# Patient Record
Sex: Male | Born: 1940
Health system: Southern US, Community
[De-identification: ages and names within clinical notes are randomized; demographics above are authoritative.]

## PROBLEM LIST (undated history)

## (undated) DIAGNOSIS — M1990 Unspecified osteoarthritis, unspecified site: Secondary | ICD-10-CM

## (undated) DIAGNOSIS — I10 Essential (primary) hypertension: Secondary | ICD-10-CM

## (undated) DIAGNOSIS — K5641 Fecal impaction: Secondary | ICD-10-CM

## (undated) DIAGNOSIS — N183 Chronic kidney disease, stage 3 unspecified: Secondary | ICD-10-CM

## (undated) DIAGNOSIS — I779 Disorder of arteries and arterioles, unspecified: Secondary | ICD-10-CM

## (undated) DIAGNOSIS — N529 Male erectile dysfunction, unspecified: Secondary | ICD-10-CM

## (undated) DIAGNOSIS — I739 Peripheral vascular disease, unspecified: Secondary | ICD-10-CM

## (undated) DIAGNOSIS — R809 Proteinuria, unspecified: Secondary | ICD-10-CM

## (undated) DIAGNOSIS — E785 Hyperlipidemia, unspecified: Secondary | ICD-10-CM

## (undated) DIAGNOSIS — E119 Type 2 diabetes mellitus without complications: Secondary | ICD-10-CM

## (undated) HISTORY — DX: Essential (primary) hypertension: I10

## (undated) HISTORY — PX: WISDOM TOOTH EXTRACTION: SHX21

## (undated) HISTORY — DX: Chronic kidney disease, stage 3 unspecified: N18.30

## (undated) HISTORY — DX: Type 2 diabetes mellitus without complications: E11.9

## (undated) HISTORY — DX: Chronic kidney disease, stage 3 (moderate): N18.3

## (undated) HISTORY — DX: Proteinuria, unspecified: R80.9

## (undated) HISTORY — DX: Male erectile dysfunction, unspecified: N52.9

## (undated) HISTORY — DX: Disorder of arteries and arterioles, unspecified: I77.9

## (undated) HISTORY — PX: CIRCUMCISION: SUR203

## (undated) HISTORY — DX: Peripheral vascular disease, unspecified: I73.9

---

## 2001-10-09 ENCOUNTER — Encounter: Admission: RE | Admit: 2001-10-09 | Discharge: 2002-01-07 | Payer: Self-pay | Admitting: Internal Medicine

## 2003-11-23 ENCOUNTER — Ambulatory Visit (HOSPITAL_COMMUNITY): Admission: RE | Admit: 2003-11-23 | Discharge: 2003-11-23 | Payer: Self-pay | Admitting: Internal Medicine

## 2004-04-28 ENCOUNTER — Ambulatory Visit (HOSPITAL_COMMUNITY): Admission: RE | Admit: 2004-04-28 | Discharge: 2004-04-28 | Payer: Self-pay | Admitting: Internal Medicine

## 2014-07-26 DIAGNOSIS — Z79899 Other long term (current) drug therapy: Secondary | ICD-10-CM | POA: Diagnosis not present

## 2014-07-26 DIAGNOSIS — I10 Essential (primary) hypertension: Secondary | ICD-10-CM | POA: Diagnosis not present

## 2014-07-26 DIAGNOSIS — E785 Hyperlipidemia, unspecified: Secondary | ICD-10-CM | POA: Diagnosis not present

## 2014-07-26 DIAGNOSIS — N189 Chronic kidney disease, unspecified: Secondary | ICD-10-CM | POA: Diagnosis not present

## 2014-07-26 DIAGNOSIS — N529 Male erectile dysfunction, unspecified: Secondary | ICD-10-CM | POA: Diagnosis not present

## 2014-07-30 ENCOUNTER — Other Ambulatory Visit (HOSPITAL_COMMUNITY): Payer: Self-pay | Admitting: Internal Medicine

## 2014-07-30 DIAGNOSIS — R0989 Other specified symptoms and signs involving the circulatory and respiratory systems: Secondary | ICD-10-CM

## 2014-07-30 DIAGNOSIS — I10 Essential (primary) hypertension: Secondary | ICD-10-CM | POA: Diagnosis not present

## 2014-07-30 DIAGNOSIS — E785 Hyperlipidemia, unspecified: Secondary | ICD-10-CM | POA: Diagnosis not present

## 2014-07-30 DIAGNOSIS — N183 Chronic kidney disease, stage 3 (moderate): Secondary | ICD-10-CM | POA: Diagnosis not present

## 2014-07-30 DIAGNOSIS — E1129 Type 2 diabetes mellitus with other diabetic kidney complication: Secondary | ICD-10-CM | POA: Diagnosis not present

## 2014-08-03 ENCOUNTER — Ambulatory Visit (HOSPITAL_COMMUNITY)
Admission: RE | Admit: 2014-08-03 | Discharge: 2014-08-03 | Disposition: A | Discharge status: Home / Self Care | Payer: Medicare Other | Source: Ambulatory Visit | Attending: Internal Medicine | Admitting: Internal Medicine

## 2014-08-03 ENCOUNTER — Ambulatory Visit (HOSPITAL_COMMUNITY): Payer: Self-pay

## 2014-08-03 DIAGNOSIS — R0989 Other specified symptoms and signs involving the circulatory and respiratory systems: Secondary | ICD-10-CM | POA: Insufficient documentation

## 2014-08-03 DIAGNOSIS — I10 Essential (primary) hypertension: Secondary | ICD-10-CM | POA: Diagnosis not present

## 2014-08-03 DIAGNOSIS — Z87891 Personal history of nicotine dependence: Secondary | ICD-10-CM | POA: Insufficient documentation

## 2014-08-03 DIAGNOSIS — E119 Type 2 diabetes mellitus without complications: Secondary | ICD-10-CM | POA: Insufficient documentation

## 2014-08-03 DIAGNOSIS — I6523 Occlusion and stenosis of bilateral carotid arteries: Secondary | ICD-10-CM | POA: Diagnosis not present

## 2014-08-19 DIAGNOSIS — M25562 Pain in left knee: Secondary | ICD-10-CM | POA: Diagnosis not present

## 2014-12-27 DIAGNOSIS — Z79899 Other long term (current) drug therapy: Secondary | ICD-10-CM | POA: Diagnosis not present

## 2014-12-27 DIAGNOSIS — E119 Type 2 diabetes mellitus without complications: Secondary | ICD-10-CM | POA: Diagnosis not present

## 2015-01-07 DIAGNOSIS — E119 Type 2 diabetes mellitus without complications: Secondary | ICD-10-CM | POA: Diagnosis not present

## 2015-01-07 DIAGNOSIS — Z23 Encounter for immunization: Secondary | ICD-10-CM | POA: Diagnosis not present

## 2015-01-07 DIAGNOSIS — N183 Chronic kidney disease, stage 3 (moderate): Secondary | ICD-10-CM | POA: Diagnosis not present

## 2015-01-07 DIAGNOSIS — I10 Essential (primary) hypertension: Secondary | ICD-10-CM | POA: Diagnosis not present

## 2015-01-07 DIAGNOSIS — Z6820 Body mass index (BMI) 20.0-20.9, adult: Secondary | ICD-10-CM | POA: Diagnosis not present

## 2015-04-07 DIAGNOSIS — Z6820 Body mass index (BMI) 20.0-20.9, adult: Secondary | ICD-10-CM | POA: Diagnosis not present

## 2015-04-07 DIAGNOSIS — K625 Hemorrhage of anus and rectum: Secondary | ICD-10-CM | POA: Diagnosis not present

## 2015-04-20 ENCOUNTER — Telehealth: Payer: Self-pay

## 2015-04-20 NOTE — Telephone Encounter (Signed)
Gastroenterology Pre-Procedure Review  Request Date: Requesting Physician:   PATIENT REVIEW QUESTIONS: The patient responded to the following health history questions as indicated:    1. Diabetes Melitis: NO 2. Joint replacements in the past 12 months: NO 3. Major health problems in the past 3 months: NO 4. Has an artificial valve or MVP: NO 5. Has a defibrillator: NO 6. Has been advised in past to take antibiotics in advance of a procedure like teeth cleaning: NO 7. Family history of colon cancer: NO 8. Alcohol Use: YES: pint in a week 9. History of sleep apnea: NO    MEDICATIONS & ALLERGIES:    Patient reports the following regarding taking any blood thinners:   Plavix? NO Aspirin? NO Coumadin? NO  Patient confirms/reports the following medications:  Current Outpatient Prescriptions  Medication Sig Dispense Refill  . amLODipine (NORVASC) 10 MG tablet Take 10 mg by mouth daily.    Marland Kitchen aspirin 81 MG tablet Take 81 mg by mouth daily.    Marland Kitchen lisinopril (PRINIVIL,ZESTRIL) 20 MG tablet Take 20 mg by mouth daily.    . simvastatin (ZOCOR) 20 MG tablet Take 20 mg by mouth daily.     No current facility-administered medications for this visit.    Patient confirms/reports the following allergies:  Allergies not on file  No orders of the defined types were placed in this encounter.    AUTHORIZATION INFORMATION Primary Insurance: [facility] AAPR Medicare,  [place] [id number] ,  Group #: [id number] Pre-Cert / Josem Kaufmann required: Pre-Cert / Auth #:    SCHEDULE INFORMATION: Procedure has been scheduled as follows:  Date: , Time:  Location:   This Gastroenterology Pre-Precedure Review Form is being routed to the following provider(s):

## 2015-04-20 NOTE — Telephone Encounter (Signed)
Discussed with Dr. Gala Romney. He agrees to triage BUT we need to order phenergan 12.5mg  IV 30 minutes before procedure given his etoh use.

## 2015-04-21 NOTE — Telephone Encounter (Signed)
Tried to call with answer  

## 2015-04-25 NOTE — Telephone Encounter (Signed)
Tried to call with no answer  

## 2015-04-26 ENCOUNTER — Other Ambulatory Visit: Payer: Self-pay

## 2015-04-26 DIAGNOSIS — Z1211 Encounter for screening for malignant neoplasm of colon: Secondary | ICD-10-CM

## 2015-04-26 MED ORDER — PEG 3350-KCL-NA BICARB-NACL 420 G PO SOLR: 4000.0000 mL | ORAL | Status: AC

## 2015-04-26 NOTE — Telephone Encounter (Signed)
Pt is set up for TCS on 05/11/15 @ 230 pm instructions are in the mail and he is aware

## 2015-04-26 NOTE — Telephone Encounter (Signed)
PA # [id number]

## 2015-05-06 DIAGNOSIS — E119 Type 2 diabetes mellitus without complications: Secondary | ICD-10-CM | POA: Diagnosis not present

## 2015-05-06 DIAGNOSIS — Z79899 Other long term (current) drug therapy: Secondary | ICD-10-CM | POA: Diagnosis not present

## 2015-05-06 DIAGNOSIS — E785 Hyperlipidemia, unspecified: Secondary | ICD-10-CM | POA: Diagnosis not present

## 2015-05-06 DIAGNOSIS — N189 Chronic kidney disease, unspecified: Secondary | ICD-10-CM | POA: Diagnosis not present

## 2015-05-06 DIAGNOSIS — I10 Essential (primary) hypertension: Secondary | ICD-10-CM | POA: Diagnosis not present

## 2015-05-11 ENCOUNTER — Encounter (HOSPITAL_COMMUNITY)
Admission: RE | Disposition: A | Discharge status: Home / Self Care | Payer: Self-pay | Source: Ambulatory Visit | Attending: Internal Medicine

## 2015-05-11 ENCOUNTER — Ambulatory Visit (HOSPITAL_COMMUNITY)
Admission: RE | Admit: 2015-05-11 | Discharge: 2015-05-11 | Disposition: A | Discharge status: Home / Self Care | Payer: Medicare Other | Source: Ambulatory Visit | Attending: Internal Medicine | Admitting: Internal Medicine

## 2015-05-11 ENCOUNTER — Encounter (HOSPITAL_COMMUNITY): Payer: Self-pay | Admitting: *Deleted

## 2015-05-11 DIAGNOSIS — D122 Benign neoplasm of ascending colon: Secondary | ICD-10-CM | POA: Insufficient documentation

## 2015-05-11 DIAGNOSIS — Z1211 Encounter for screening for malignant neoplasm of colon: Secondary | ICD-10-CM | POA: Insufficient documentation

## 2015-05-11 DIAGNOSIS — D124 Benign neoplasm of descending colon: Secondary | ICD-10-CM | POA: Diagnosis not present

## 2015-05-11 DIAGNOSIS — Z7982 Long term (current) use of aspirin: Secondary | ICD-10-CM | POA: Insufficient documentation

## 2015-05-11 DIAGNOSIS — K5730 Diverticulosis of large intestine without perforation or abscess without bleeding: Secondary | ICD-10-CM | POA: Insufficient documentation

## 2015-05-11 HISTORY — PX: COLONOSCOPY: SHX5424

## 2015-05-11 SURGERY — COLONOSCOPY
Anesthesia: Moderate Sedation

## 2015-05-11 MED ORDER — ONDANSETRON HCL 4 MG/2ML IJ SOLN
INTRAMUSCULAR | Status: DC | PRN
  Administered 2015-05-11: 4 mg via INTRAVENOUS

## 2015-05-11 MED ORDER — MEPERIDINE HCL 100 MG/ML IJ SOLN
INTRAMUSCULAR | Status: DC | PRN
  Administered 2015-05-11: 50 mg via INTRAVENOUS
  Administered 2015-05-11: 25 mg via INTRAVENOUS

## 2015-05-11 MED ORDER — MIDAZOLAM HCL 5 MG/5ML IJ SOLN
INTRAMUSCULAR | Status: AC
  Filled 2015-05-11: qty 10

## 2015-05-11 MED ORDER — MIDAZOLAM HCL 5 MG/5ML IJ SOLN
INTRAMUSCULAR | Status: DC | PRN
  Administered 2015-05-11: 2 mg via INTRAVENOUS
  Administered 2015-05-11: 1 mg via INTRAVENOUS

## 2015-05-11 MED ORDER — SODIUM CHLORIDE 0.9% FLUSH
INTRAVENOUS | Status: AC
  Filled 2015-05-11: qty 10

## 2015-05-11 MED ORDER — PROMETHAZINE HCL 25 MG/ML IJ SOLN
INTRAMUSCULAR | Status: AC
  Filled 2015-05-11: qty 1

## 2015-05-11 MED ORDER — SODIUM CHLORIDE 0.9 % IV SOLN
INTRAVENOUS | Status: DC
  Administered 2015-05-11: 1000 mL via INTRAVENOUS

## 2015-05-11 MED ORDER — STERILE WATER FOR IRRIGATION IR SOLN
Status: DC | PRN
  Administered 2015-05-11: 2.5 mL

## 2015-05-11 MED ORDER — PROMETHAZINE HCL 25 MG/ML IJ SOLN
12.5000 mg | Freq: Once | INTRAMUSCULAR | Status: AC
  Administered 2015-05-11: 12.5 mg via INTRAVENOUS

## 2015-05-11 MED ORDER — MEPERIDINE HCL 100 MG/ML IJ SOLN
INTRAMUSCULAR | Status: AC
  Filled 2015-05-11: qty 2

## 2015-05-11 MED ORDER — ONDANSETRON HCL 4 MG/2ML IJ SOLN
INTRAMUSCULAR | Status: AC
  Filled 2015-05-11: qty 2

## 2015-05-11 NOTE — Op Note (Signed)
[facility] - Miguel Webb [address] [place], [address]   COLONOSCOPY PROCEDURE REPORT  PATIENT: Miguel Webb, Miguel Webb  MR#: [id number] BIRTHDATE: 1941/01/04 , 51  yrs. old GENDER: male ENDOSCOPIST: R.  Garfield Cornea, MD FACP [facility] REFERRED BY:Roy Willey Blade, M.D. PROCEDURE DATE:  13-May-2015 PROCEDURE:   Colonoscopy with snare polypectomy INDICATIONS:Average risk colorectal cancer screening examination. MEDICATIONS: Versed 3 mg IV and Demerol 75 mg IV in divided doses. Phenergan 12.5 mg IV.  Zofran 4 mg IV. ASA CLASS:       Class II  CONSENT: The risks, benefits, alternatives and imponderables including but not limited to bleeding, perforation as well as the possibility of a missed lesion have been reviewed.  The potential for biopsy, lesion removal, etc. have also been discussed. Questions have been answered.  All parties agreeable.  Please see the history and physical in the medical record for more information.  DESCRIPTION OF PROCEDURE:   After the risks benefits and alternatives of the procedure were thoroughly explained, informed consent was obtained.  The digital rectal exam revealed no abnormalities of the rectum.   The [id number]  endoscope was introduced through the anus and advanced to the cecum, which was identified by both the appendix and ileocecal valve. No adverse events experienced.   The quality of the prep was adequate  The instrument was then slowly withdrawn as the colon was fully examined. Estimated blood loss is zero unless otherwise noted in this procedure report.      COLON FINDINGS: Normal-appearing rectal mucosa.  Pancolonic diverticula (left side greater than right).  Patient had (1) 5 mm polyp in the ascending segment and a second 5 mm polyp in the mid descending segment; otherwise, the remainder of the colonic mucosa appeared normal.  The above-mentioned polyps were cold snare removed.  Retroflexion was performed. .  Withdrawal  time=10 minutes 0 seconds.  The scope was withdrawn and the procedure completed. COMPLICATIONS: There were no immediate complications.  ENDOSCOPIC IMPRESSION: Pancolonic diverticulosis. Multiple colonic polyps?"removed as described above  RECOMMENDATIONS: Follow-up on pathology.  eSigned:  R. Garfield Cornea, MD Rosalita Chessman [facility] 13-May-2015 3:20 PM   cc:  CPT CODES: ICD CODES:  The ICD and CPT codes recommended by this software are interpretations from the data that the clinical staff has captured with the software.  The verification of the translation of this report to the ICD and CPT codes and modifiers is the sole responsibility of the health care institution and practicing physician where this report was generated.  [employer] will not be held responsible for the validity of the ICD and CPT codes included on this report.  AMA assumes no liability for data contained or not contained herein. CPT is a Designer, television/film set of the [employer].  PATIENT NAME:  Miguel Webb, Miguel Webb MR#: [id number]

## 2015-05-11 NOTE — Discharge Instructions (Signed)
°Colonoscopy °Discharge Instructions ° °Read the instructions outlined below and refer to this sheet in the next few weeks. These discharge instructions provide you with general information on caring for yourself after you leave the hospital. Your doctor may also give you specific instructions. While your treatment has been planned according to the most current medical practices available, unavoidable complications occasionally occur. If you have any problems or questions after discharge, call Dr. Rourk at [phone]. °ACTIVITY °· You may resume your regular activity, but move at a slower pace for the next 24 hours.  °· Take frequent rest periods for the next 24 hours.  °· Walking will help get rid of the air and reduce the bloated feeling in your belly (abdomen).  °· No driving for 24 hours (because of the medicine (anesthesia) used during the test).   °· Do not sign any important legal documents or operate any machinery for 24 hours (because of the anesthesia used during the test).  °NUTRITION °· Drink plenty of fluids.  °· You may resume your normal diet as instructed by your doctor.  °· Begin with a light meal and progress to your normal diet. Heavy or fried foods are harder to digest and may make you feel sick to your stomach (nauseated).  °· Avoid alcoholic beverages for 24 hours or as instructed.  °MEDICATIONS °· You may resume your normal medications unless your doctor tells you otherwise.  °WHAT YOU CAN EXPECT TODAY °· Some feelings of bloating in the abdomen.  °· Passage of more gas than usual.  °· Spotting of blood in your stool or on the toilet paper.  °IF YOU HAD POLYPS REMOVED DURING THE COLONOSCOPY: °· No aspirin products for 7 days or as instructed.  °· No alcohol for 7 days or as instructed.  °· Eat a soft diet for the next 24 hours.  °FINDING OUT THE RESULTS OF YOUR TEST °Not all test results are available during your visit. If your test results are not back during the visit, make an appointment  with your caregiver to find out the results. Do not assume everything is normal if you have not heard from your caregiver or the medical facility. It is important for you to follow up on all of your test results.  °SEEK IMMEDIATE MEDICAL ATTENTION IF: °· You have more than a spotting of blood in your stool.  °· Your belly is swollen (abdominal distention).  °· You are nauseated or vomiting.  °· You have a temperature over 101.  °· You have abdominal pain or discomfort that is severe or gets worse throughout the day.  ° °Diverticulosis and polyp information provided. ° °Further recommendations to follow pending review of pathology report ° ° ° ° °Diverticulosis °Diverticulosis is the condition that develops when small pouches (diverticula) form in the wall of your colon. Your colon, or large intestine, is where water is absorbed and stool is formed. The pouches form when the inside layer of your colon pushes through weak spots in the outer layers of your colon. °CAUSES  °No one knows exactly what causes diverticulosis. °RISK FACTORS °· Being older than 50. Your risk for this condition increases with age. Diverticulosis is rare in people younger than 40 years. By age 80, almost everyone has it. °· Eating a low-fiber diet. °· Being frequently constipated. °· Being overweight. °· Not getting enough exercise. °· Smoking. °· Taking over-the-counter pain medicines, like aspirin and ibuprofen. °SYMPTOMS  °Most people with diverticulosis do not have symptoms. °DIAGNOSIS  °  Because diverticulosis often has no symptoms, health care providers often discover the condition during an exam for other colon problems. In many cases, a health care provider will diagnose diverticulosis while using a flexible scope to examine the colon (colonoscopy). °TREATMENT  °If you have never developed an infection related to diverticulosis, you may not need treatment. If you have had an infection before, treatment may include: °· Eating more fruits,  vegetables, and grains. °· Taking a fiber supplement. °· Taking a live bacteria supplement (probiotic). °· Taking medicine to relax your colon. °HOME CARE INSTRUCTIONS  °· Drink at least 6-8 glasses of water each day to prevent constipation. °· Try not to strain when you have a bowel movement. °· Keep all follow-up appointments. °If you have had an infection before:  °· Increase the fiber in your diet as directed by your health care provider or dietitian. °· Take a dietary fiber supplement if your health care provider approves. °· Only take medicines as directed by your health care provider. °SEEK MEDICAL CARE IF:  °· You have abdominal pain. °· You have bloating. °· You have cramps. °· You have not gone to the bathroom in 3 days. °SEEK IMMEDIATE MEDICAL CARE IF:  °· Your pain gets worse. °· Your bloating becomes very bad. °· You have a fever or chills, and your symptoms suddenly get worse. °· You begin vomiting. °· You have bowel movements that are bloody or black. °MAKE SURE YOU: °· Understand these instructions. °· Will watch your condition. °· Will get help right away if you are not doing well or get worse. °  °This information is not intended to replace advice given to you by your health care provider. Make sure you discuss any questions you have with your health care provider. °  °Document Released: 12/15/2003 Document Revised: 03/24/2013 Document Reviewed: 02/11/2013 °Elsevier Interactive Patient Education ©2016 Elsevier Inc. °Colon Polyps °Polyps are lumps of extra tissue growing inside the body. Polyps can grow in the large intestine (colon). Most colon polyps are noncancerous (benign). However, some colon polyps can become cancerous over time. Polyps that are larger than a pea may be harmful. To be safe, caregivers remove and test all polyps. °CAUSES  °Polyps form when mutations in the genes cause your cells to grow and divide even though no more tissue is needed. °RISK FACTORS °There are a number of risk  factors that can increase your chances of getting colon polyps. They include: °· Being older than 50 years. °· Family history of colon polyps or colon cancer. °· Long-term colon diseases, such as colitis or Crohn disease. °· Being overweight. °· Smoking. °· Being inactive. °· Drinking too much alcohol. °SYMPTOMS  °Most small polyps do not cause symptoms. If symptoms are present, they may include: °· Blood in the stool. The stool may look dark red or black. °· Constipation or diarrhea that lasts longer than 1 week. °DIAGNOSIS °People often do not know they have polyps until their caregiver finds them during a regular checkup. Your caregiver can use 4 tests to check for polyps: °· Digital rectal exam. The caregiver wears gloves and feels inside the rectum. This test would find polyps only in the rectum. °· Barium enema. The caregiver puts a liquid called barium into your rectum before taking X-rays of your colon. Barium makes your colon look white. Polyps are dark, so they are easy to see in the X-ray pictures. °· Sigmoidoscopy. A thin, flexible tube (sigmoidoscope) is placed into your rectum. The sigmoidoscope has a   light and tiny camera in it. The caregiver uses the sigmoidoscope to look at the last third of your colon. °· Colonoscopy. This test is like sigmoidoscopy, but the caregiver looks at the entire colon. This is the most common method for finding and removing polyps. °TREATMENT  °Any polyps will be removed during a sigmoidoscopy or colonoscopy. The polyps are then tested for cancer. °PREVENTION  °To help lower your risk of getting more colon polyps: °· Eat plenty of fruits and vegetables. Avoid eating fatty foods. °· Do not smoke. °· Avoid drinking alcohol. °· Exercise every day. °· Lose weight if recommended by your caregiver. °· Eat plenty of calcium and folate. Foods that are rich in calcium include milk, cheese, and broccoli. Foods that are rich in folate include chickpeas, kidney beans, and  spinach. °HOME CARE INSTRUCTIONS °Keep all follow-up appointments as directed by your caregiver. You may need periodic exams to check for polyps. °SEEK MEDICAL CARE IF: °You notice bleeding during a bowel movement. °  °This information is not intended to replace advice given to you by your health care provider. Make sure you discuss any questions you have with your health care provider. °  °Document Released: 12/14/2003 Document Revised: 04/09/2014 Document Reviewed: 05/29/2011 °Elsevier Interactive Patient Education ©2016 Elsevier Inc. ° °

## 2015-05-11 NOTE — H&P (Signed)
@  [id number]   Primary Care Physician:  Asencion Noble, MD Primary Gastroenterologist:  Dr. Gala Romney  Pre-Procedure History & Physical: HPI:  Miguel Webb is a 74 y.o. male is here for a screening colonoscopy. Patient states he's had a colonoscopy of [facility] Na several years ago. We do not have old records although there is sketchy documentation in the EMR of my seeing him in an encounter in 2005. Patient has no GI symptoms aside from chronic constipation. This is well managed with MiraLAX. No family history of colon cancer. He denies any prior history of polyps.   No past medical history on file.  No past surgical history on file.  Prior to Admission medications   Medication Sig Start Date End Date Taking? Authorizing Provider  amLODipine (NORVASC) 10 MG tablet Take 10 mg by mouth daily.   Yes Historical Provider, MD  aspirin 81 MG tablet Take 81 mg by mouth daily.   Yes Historical Provider, MD  lisinopril (PRINIVIL,ZESTRIL) 20 MG tablet Take 20 mg by mouth daily.   Yes Historical Provider, MD  simvastatin (ZOCOR) 20 MG tablet Take 20 mg by mouth daily.   Yes Historical Provider, MD  polyethylene glycol-electrolytes (TRILYTE) 420 g solution Take 4,000 mLs by mouth as directed. 04/26/15   [name], MD    Allergies as of 04/26/2015  . (Not on File)    No family history on file.  Social History   Social History  . Marital Status: Married    Spouse Name: N/A  . Number of Children: N/A  . Years of Education: N/A   Occupational History  . Not on file.   Social History Main Topics  . Smoking status: Not on file  . Smokeless tobacco: Not on file  . Alcohol Use: Not on file  . Drug Use: Not on file  . Sexual Activity: Not on file   Other Topics Concern  . Not on file   Social History Narrative  . No narrative on file    Review of Systems: See HPI, otherwise negative ROS  Physical Exam: There were no vitals taken for this visit. General:   Alert,  Well-developed,  well-nourished, pleasant and cooperative in NAD Lungs:  Clear throughout to auscultation.   No wheezes, crackles, or rhonchi. No acute distress. Heart:  Regular rate and rhythm; no murmurs, clicks, rubs,  or gallops. Abdomen:  Soft, nontender and nondistended. No masses, hepatosplenomegaly or hernias noted. Normal bowel sounds, without guarding, and without rebound.    Impression:  Miguel Webb is now here to undergo a screening colonoscopy.  Average risk screening candidate. Regular alcohol consumption noted.   Recommendations: Average risk screening colonoscopy today. Risks, benefits, limitations, imponderables and alternatives regarding colonoscopy have been reviewed with the patient. Questions have been answered. All parties agreeable.  We'll premedicate with Phenergan 12.5 mg IV prior to the procedure      Notice:  This dictation was prepared with Dragon dictation along with smaller phrase technology. Any transcriptional errors that result from this process are unintentional and may not be corrected upon review.

## 2015-05-13 DIAGNOSIS — I10 Essential (primary) hypertension: Secondary | ICD-10-CM | POA: Diagnosis not present

## 2015-05-13 DIAGNOSIS — N183 Chronic kidney disease, stage 3 (moderate): Secondary | ICD-10-CM | POA: Diagnosis not present

## 2015-05-13 DIAGNOSIS — E119 Type 2 diabetes mellitus without complications: Secondary | ICD-10-CM | POA: Diagnosis not present

## 2015-05-18 ENCOUNTER — Encounter (HOSPITAL_COMMUNITY): Payer: Self-pay | Admitting: Internal Medicine

## 2015-05-19 ENCOUNTER — Encounter: Payer: Self-pay | Admitting: Internal Medicine

## 2015-09-05 DIAGNOSIS — E785 Hyperlipidemia, unspecified: Secondary | ICD-10-CM | POA: Diagnosis not present

## 2015-09-05 DIAGNOSIS — I10 Essential (primary) hypertension: Secondary | ICD-10-CM | POA: Diagnosis not present

## 2015-09-05 DIAGNOSIS — N189 Chronic kidney disease, unspecified: Secondary | ICD-10-CM | POA: Diagnosis not present

## 2015-09-05 DIAGNOSIS — Z79899 Other long term (current) drug therapy: Secondary | ICD-10-CM | POA: Diagnosis not present

## 2015-09-05 DIAGNOSIS — E119 Type 2 diabetes mellitus without complications: Secondary | ICD-10-CM | POA: Diagnosis not present

## 2015-09-13 DIAGNOSIS — I10 Essential (primary) hypertension: Secondary | ICD-10-CM | POA: Diagnosis not present

## 2015-09-13 DIAGNOSIS — R0989 Other specified symptoms and signs involving the circulatory and respiratory systems: Secondary | ICD-10-CM | POA: Diagnosis not present

## 2015-09-13 DIAGNOSIS — E785 Hyperlipidemia, unspecified: Secondary | ICD-10-CM | POA: Diagnosis not present

## 2015-09-13 DIAGNOSIS — E1129 Type 2 diabetes mellitus with other diabetic kidney complication: Secondary | ICD-10-CM | POA: Diagnosis not present

## 2015-09-13 DIAGNOSIS — Z23 Encounter for immunization: Secondary | ICD-10-CM | POA: Diagnosis not present

## 2015-09-16 ENCOUNTER — Other Ambulatory Visit (HOSPITAL_COMMUNITY): Payer: Self-pay | Admitting: Internal Medicine

## 2015-09-16 DIAGNOSIS — I6523 Occlusion and stenosis of bilateral carotid arteries: Secondary | ICD-10-CM

## 2015-09-20 ENCOUNTER — Ambulatory Visit (HOSPITAL_COMMUNITY)
Admission: RE | Admit: 2015-09-20 | Discharge: 2015-09-20 | Disposition: A | Discharge status: Home / Self Care | Payer: Medicare Other | Source: Ambulatory Visit | Attending: Internal Medicine | Admitting: Internal Medicine

## 2015-09-20 DIAGNOSIS — I6523 Occlusion and stenosis of bilateral carotid arteries: Secondary | ICD-10-CM | POA: Insufficient documentation

## 2015-10-07 ENCOUNTER — Ambulatory Visit (INDEPENDENT_AMBULATORY_CARE_PROVIDER_SITE_OTHER): Payer: Medicare Other | Admitting: Vascular Surgery

## 2015-10-07 ENCOUNTER — Encounter: Payer: Self-pay | Admitting: Vascular Surgery

## 2015-10-07 ENCOUNTER — Other Ambulatory Visit: Payer: Self-pay

## 2015-10-07 VITALS — BP 132/75 | HR 82 | Ht 69.5 in | Wt 133.7 lb

## 2015-10-07 DIAGNOSIS — I6523 Occlusion and stenosis of bilateral carotid arteries: Secondary | ICD-10-CM | POA: Diagnosis not present

## 2015-10-07 NOTE — Progress Notes (Signed)
[facility] [place]  Patient name: Miguel Webb MRN: [id number] DOB: 06/02/1940 Sex: male  REASON FOR CONSULT: left carotid stenosis. Referred by Dr. Asencion Noble  HPI: Miguel Webb is a 75 y.o. male, who is referred for a carotid evaluation.  I have reviewed the records that were sent from Dr. Ria Comment office. The patient has a history of essential hypertension, hyperlipidemia, type 2 diabetes, and chronic kidney disease. The patient was found to have a left carotid bruit which prompted the carotid duplex scan.  A duplex scan showed a greater than 70% left carotid stenosis and he was sent for vascular consultation. He is right-handed. He denies any history of stroke, TIAs, expressive or receptive aphasia, or amaurosis fugax.  He denies any history of myocardial infarction, congestive heart failure, chest pain or significant shortness of breath. He is very active and works 2 part-time jobs. He is not a smoker.   Past Medical History  Diagnosis Date  . Hypertension   . Peripheral vascular disease ([place])     Family History  Problem Relation Age of Onset  . Stroke Father   . Breast cancer Sister   . Alzheimer's disease Brother   his brother had heart problems in his 40s.  SOCIAL HISTORY: Social History   Social History  . Marital Status: Married    Spouse Name: N/A  . Number of Children: N/A  . Years of Education: N/A   Occupational History  . Not on file.   Social History Main Topics  . Smoking status: Former Research scientist (life sciences)  . Smokeless tobacco: Not on file  . Alcohol Use: Yes     Comment: 2-3 times per week drinks a little vodka  . Drug Use: No  . Sexual Activity: Not on file   Other Topics Concern  . Not on file   Social History Narrative    No Known Allergies  Current Outpatient Prescriptions  Medication Sig Dispense Refill  . amLODipine (NORVASC) 10 MG tablet Take 10 mg by mouth daily.    Marland Kitchen aspirin 81 MG tablet Take 81 mg by mouth daily.     Marland Kitchen lisinopril (PRINIVIL,ZESTRIL) 20 MG tablet Take 20 mg by mouth daily.    . simvastatin (ZOCOR) 20 MG tablet Take 20 mg by mouth daily.    . polyethylene glycol-electrolytes (TRILYTE) 420 g solution Take 4,000 mLs by mouth as directed. (Patient not taking: Reported on 10/07/2015) 4000 mL 0   No current facility-administered medications for this visit.    REVIEW OF SYSTEMS:  [X]  denotes positive finding, [ ]  denotes negative finding Cardiac  Comments:  Chest pain or chest pressure:    Shortness of breath upon exertion:    Short of breath when lying flat:    Irregular heart rhythm:        Vascular    Pain in calf, thigh, or hip brought on by ambulation:    Pain in feet at night that wakes you up from your sleep:     Blood clot in your veins:    Leg swelling:         Pulmonary    Oxygen at home:    Productive cough:     Wheezing:         Neurologic    Sudden weakness in arms or legs:     Sudden numbness in arms or legs:     Sudden onset of difficulty speaking or slurred speech:    Temporary loss of vision  in one eye:     Problems with dizziness:         Gastrointestinal    Blood in stool:     Vomited blood:         Genitourinary    Burning when urinating:     Blood in urine:        Psychiatric    Major depression:         Hematologic    Bleeding problems:    Problems with blood clotting too easily:        Skin    Rashes or ulcers:        Constitutional    Fever or chills:      PHYSICAL EXAM: Filed Vitals:    GENERAL: The patient is a well-nourished male, in no acute distress. The vital signs are documented above. CARDIAC: There is a regular rate and rhythm.  VASCULAR: He has a left carotid bruit. He has a palpable left dorsalis pedis pulse. Otherwise I cannot palpate pedal pulses however both feet are warm and well perfused. PULMONARY: There is good air exchange bilaterally without wheezing or rales. ABDOMEN: Soft and non-tender with normal pitched  bowel sounds.  MUSCULOSKELETAL: There are no major deformities or cyanosis. NEUROLOGIC: No focal weakness or paresthesias are detected. SKIN: There are no ulcers or rashes noted. PSYCHIATRIC: The patient has a normal affect.  DATA:   CAROTID DUPLEX: I have reviewed the carotid duplex can that was done on 09/20/2015. This was done at [facility]. There was a less than 50% right carotid stenosis. On the left side, there was moderate to severe irregular plaque consistent with a greater than 70% stenosis.  On the left side, the peak systolic velocity was 99991111 cm/s with end-diastolic loss fever [id number] cm/s. The ICA to CCA ratio is 4.3. Based on his peak systolic velocity in the left internal carotid artery, and the ICA to CCA ratio, I think the stenosis on the left is greater than 80%.  GFR on 09/05/2015 was 54. Creatinine was 1.44 hemoglobin A1c was 6.0  MEDICAL ISSUES:  GREATER THAN 80% LEFT CAROTID STENOSIS: This patient has a greater than 80% left carotid stenosis. He is asymptomatic. I have recommended left carotid endarterectomy. I have reviewed the indications for carotid endarterectomy, that is to lower the risk of future stroke. I have also reviewed the potential complications of surgery, including but not limited to: bleeding, stroke (perioperative risk 1-2%), MI, nerve injury of other unpredictable medical problems. All of the patients questions were answered and they are agreeable to proceed with surgery. He will continue his aspirin right up to surgery. He is on a statin also. His surgery is scheduled for 10/20/2015.     Deitra Mayo [facility] [place] [phone]

## 2015-10-18 ENCOUNTER — Encounter (HOSPITAL_COMMUNITY): Payer: Self-pay

## 2015-10-18 ENCOUNTER — Encounter (HOSPITAL_COMMUNITY)
Admission: RE | Admit: 2015-10-18 | Discharge: 2015-10-18 | Disposition: A | Discharge status: Home / Self Care | Payer: Medicare Other | Source: Ambulatory Visit | Attending: Vascular Surgery | Admitting: Vascular Surgery

## 2015-10-18 DIAGNOSIS — E785 Hyperlipidemia, unspecified: Secondary | ICD-10-CM | POA: Diagnosis not present

## 2015-10-18 DIAGNOSIS — I6523 Occlusion and stenosis of bilateral carotid arteries: Secondary | ICD-10-CM | POA: Diagnosis not present

## 2015-10-18 DIAGNOSIS — Z79899 Other long term (current) drug therapy: Secondary | ICD-10-CM | POA: Diagnosis not present

## 2015-10-18 DIAGNOSIS — N183 Chronic kidney disease, stage 3 (moderate): Secondary | ICD-10-CM | POA: Diagnosis not present

## 2015-10-18 DIAGNOSIS — E1122 Type 2 diabetes mellitus with diabetic chronic kidney disease: Secondary | ICD-10-CM | POA: Diagnosis not present

## 2015-10-18 DIAGNOSIS — I129 Hypertensive chronic kidney disease with stage 1 through stage 4 chronic kidney disease, or unspecified chronic kidney disease: Secondary | ICD-10-CM | POA: Diagnosis not present

## 2015-10-18 DIAGNOSIS — I739 Peripheral vascular disease, unspecified: Secondary | ICD-10-CM | POA: Diagnosis not present

## 2015-10-18 DIAGNOSIS — Z87891 Personal history of nicotine dependence: Secondary | ICD-10-CM | POA: Diagnosis not present

## 2015-10-18 DIAGNOSIS — Z7982 Long term (current) use of aspirin: Secondary | ICD-10-CM | POA: Diagnosis not present

## 2015-10-18 HISTORY — DX: Unspecified osteoarthritis, unspecified site: M19.90

## 2015-10-18 HISTORY — DX: Hyperlipidemia, unspecified: E78.5

## 2015-10-18 LAB — COMPREHENSIVE METABOLIC PANEL
ALT: 24 U/L (ref 17–63)
ANION GAP: 7 (ref 5–15)
AST: 34 U/L (ref 15–41)
Albumin: 4.4 g/dL (ref 3.5–5.0)
Alkaline Phosphatase: 44 U/L (ref 38–126)
BILIRUBIN TOTAL: 0.6 mg/dL (ref 0.3–1.2)
BUN: 18 mg/dL (ref 6–20)
CHLORIDE: 108 mmol/L (ref 101–111)
CO2: 26 mmol/L (ref 22–32)
Calcium: 9.7 mg/dL (ref 8.9–10.3)
Creatinine, Ser: 1.62 mg/dL — ABNORMAL HIGH (ref 0.61–1.24)
GFR, EST AFRICAN AMERICAN: 46 mL/min — AB (ref >60)
GFR, EST NON AFRICAN AMERICAN: 40 mL/min — AB (ref >60)
Glucose, Bld: 81 mg/dL (ref 65–99)
POTASSIUM: 4.3 mmol/L (ref 3.5–5.1)
Sodium: 141 mmol/L (ref 135–145)
TOTAL PROTEIN: 7.8 g/dL (ref 6.5–8.1)

## 2015-10-18 LAB — URINE MICROSCOPIC-ADD ON

## 2015-10-18 LAB — URINALYSIS, ROUTINE W REFLEX MICROSCOPIC
GLUCOSE, UA: NEGATIVE mg/dL
Hgb urine dipstick: NEGATIVE
Ketones, ur: 15 mg/dL — AB
Leukocytes, UA: NEGATIVE
NITRITE: NEGATIVE
PH: 6 (ref 5.0–8.0)
Protein, ur: 30 mg/dL — AB
SPECIFIC GRAVITY, URINE: 1.027 (ref 1.005–1.030)

## 2015-10-18 LAB — CBC
HEMATOCRIT: 44.2 % (ref 39.0–52.0)
Hemoglobin: 14.5 g/dL (ref 13.0–17.0)
MCH: 29.1 pg (ref 26.0–34.0)
MCHC: 32.8 g/dL (ref 30.0–36.0)
MCV: 88.8 fL (ref 78.0–100.0)
PLATELETS: 198 10*3/uL (ref 150–400)
RBC: 4.98 MIL/uL (ref 4.22–5.81)
RDW: 13.8 % (ref 11.5–15.5)
WBC: 5.3 10*3/uL (ref 4.0–10.5)

## 2015-10-18 LAB — TYPE AND SCREEN
ABO/RH(D): O POS
ANTIBODY SCREEN: NEGATIVE

## 2015-10-18 LAB — PROTIME-INR
INR: 1.06 (ref 0.00–1.49)
PROTHROMBIN TIME: 14 s (ref 11.6–15.2)

## 2015-10-18 LAB — SURGICAL PCR SCREEN
MRSA, PCR: NEGATIVE
Staphylococcus aureus: NEGATIVE

## 2015-10-18 LAB — ABO/RH: ABO/RH(D): O POS

## 2015-10-18 LAB — APTT: aPTT: 29 seconds (ref 24–37)

## 2015-10-18 NOTE — Progress Notes (Signed)
Pt denies SOB, chest pain, and being under the care of a cardiologist. Pt denies having a stress test, echo and cardiac cath. Pt denies having an EKG and chest x ray within the last year. Pt denies having any lab work within the last 2 weeks. Pt denies having any acute kidney problems, kidney disease and diabetes. Pt chart forwarded to anesthesia to review EKG and abnormal labs ( creatinine 1.62).

## 2015-10-18 NOTE — Pre-Procedure Instructions (Signed)
Miguel Webb  10/18/2015      [id number] - [place], [place] - [id number] [place] [address] Phone: [phone] Fax: [phone]    Your procedure is scheduled on Thursday, October 20, 2015  Report to [facility] Admitting at 5:30 A.M.  Call this number if you have problems the morning of surgery:  [phone]   Remember:  Do not eat food or drink liquids after midnight Wednesday, October 19, 2015  Take these medicines the morning of surgery with A SIP OF WATER : amLODipine (NORVASC), aspirin  Stop taking vitamins, fish oil, and herbal medications. Do not take any NSAIDs ie: Ibuprofen, Advil, Naproxen, BC and Goody Powder; stop now.  Do not wear jewelry, make-up or nail polish.  Do not wear lotions, powders, or perfumes.  You may not wear deoderant.  Do not shave 48 hours prior to surgery.  Men may shave face and neck.  Do not bring valuables to the hospital.  [facility] is not responsible for any belongings or valuables.  Contacts, dentures or bridgework may not be worn into surgery.  Leave your suitcase in the car.  After surgery it may be brought to your room.  For patients admitted to the hospital, discharge time will be determined by your treatment team.  Patients discharged the day of surgery will not be allowed to drive home.   Name and phone number of your driver:   Special instructions:  [place] - Preparing for Surgery  Before surgery, you can play an important role.  Because skin is not sterile, your skin needs to be as free of germs as possible.  You can reduce the number of germs on you skin by washing with CHG (chlorahexidine gluconate) soap before surgery.  CHG is an antiseptic cleaner which kills germs and bonds with the skin to continue killing germs even after washing.  Please DO NOT use if you have an allergy to CHG or antibacterial soaps.  If your skin becomes reddened/irritated stop using the  CHG and inform your nurse when you arrive at Short Stay.  Do not shave (including legs and underarms) for at least 48 hours prior to the first CHG shower.  You may shave your face.  Please follow these instructions carefully:   1.  Shower with CHG Soap the night before surgery and the morning of Surgery.  2.  If you choose to wash your hair, wash your hair first as usual with your normal shampoo.  3.  After you shampoo, rinse your hair and body thoroughly to remove the Shampoo.  4.  Use CHG as you would any other liquid soap.  You can apply chg directly  to the skin and wash gently with scrungie or a clean washcloth.  5.  Apply the CHG Soap to your body ONLY FROM THE NECK DOWN.  Do not use on open wounds or open sores.  Avoid contact with your eyes, ears, mouth and genitals (private parts).  Wash genitals (private parts) with your normal soap.  6.  Wash thoroughly, paying special attention to the area where your surgery will be performed.  7.  Thoroughly rinse your body with warm water from the neck down.  8.  DO NOT shower/wash with your normal soap after using and rinsing off the CHG Soap.  9.  Pat yourself dry with a clean towel.  10.  Wear clean pajamas.            11.  Place clean sheets on your bed the night of your first shower and do not sleep with pets.  Day of Surgery  Do not apply any lotions/deodorants the morning of surgery.  Please wear clean clothes to the hospital/surgery center.  Please read over the following fact sheets that you were given. Pain Booklet, Coughing and Deep Breathing, Blood Transfusion Information, MRSA Information and Surgical Site Infection Prevention

## 2015-10-19 ENCOUNTER — Encounter (HOSPITAL_COMMUNITY): Payer: Self-pay

## 2015-10-19 NOTE — Progress Notes (Signed)
Anesthesia Chart Review: Patient is a 75 year old male scheduled for left CEA on 10/20/15 by Dr. Scot Dock.  History includes HTN, HLD, hypoglycemia (diet controlled DM2 per PCP records; A1c 09/05/15 was consistent with pre-DM), CKD stage III (per PCP notes), PVD, former smoker.   PCP is Dr. Asencion Noble. 09/13/15 Office note scanned under Media tab.  Meds include amlodipine, ASA 62m, lisinopril, Zocor.  09/18/15 EKG: NSR, LAD, right BBB. No comparison EKG in Epic or Muse. He denied CP and SOB at PAT. He denied prior stress, echo, or cath. Dr. DNicole Cellanote describes patient as "very active and works 2 part-time jobs."  09/20/15 Carotid U/S (ordered by Dr. FWilley Blade: IMPRESSION: - Mild eccentric calcified plaque formation is noted in the right carotid bulb and proximal right internal carotid artery consistent with less than 50% diameter stenosis based on ultrasound and Doppler criteria. - Moderate to severe irregular calcified plaque is noted in the left carotid bulb and proximal left internal carotid artery consistent with greater than 70% diameter stenosis based on ultrasound and Doppler criteria. This has progressed significantly since prior exam. - Incidental note is made of 1.9 cm left thyroid nodule. Thyroid ultrasound is recommended for further evaluation.  Preoperative labs noted. BUN 18, Cr 1.62, eGFR 46. CBC, PT/PTT WNL. (Comparison labs from 09/05/15 scanned under Media tab show a Cr 1.44, eGFR 54, A1c 6.0.)  If no acute changes then I anticipate that he can proceed as planned. Dr. DScot Dockcan follow renal function post-operatively with continued out-patient PCP follow-up.  AGeorge HughMSil[facility]Short [facility]/Anesthesiology Phone ([phone]/19/2017 11:03 AM

## 2015-10-20 ENCOUNTER — Inpatient Hospital Stay (HOSPITAL_COMMUNITY)
Admission: RE | Admit: 2015-10-20 | Discharge: 2015-10-21 | DRG: 027 | Disposition: A | Discharge status: Home / Self Care | Payer: Medicare Other | Source: Ambulatory Visit | Attending: Vascular Surgery | Admitting: Vascular Surgery

## 2015-10-20 ENCOUNTER — Encounter (HOSPITAL_COMMUNITY): Payer: Self-pay | Admitting: *Deleted

## 2015-10-20 ENCOUNTER — Encounter (HOSPITAL_COMMUNITY)
Admission: RE | Disposition: A | Discharge status: Home / Self Care | Payer: Self-pay | Source: Ambulatory Visit | Attending: Vascular Surgery

## 2015-10-20 ENCOUNTER — Inpatient Hospital Stay (HOSPITAL_COMMUNITY): Payer: Medicare Other | Admitting: Certified Registered Nurse Anesthetist

## 2015-10-20 ENCOUNTER — Inpatient Hospital Stay (HOSPITAL_COMMUNITY): Payer: Medicare Other | Admitting: Vascular Surgery

## 2015-10-20 DIAGNOSIS — E785 Hyperlipidemia, unspecified: Secondary | ICD-10-CM | POA: Diagnosis not present

## 2015-10-20 DIAGNOSIS — I6529 Occlusion and stenosis of unspecified carotid artery: Secondary | ICD-10-CM | POA: Diagnosis present

## 2015-10-20 DIAGNOSIS — E1122 Type 2 diabetes mellitus with diabetic chronic kidney disease: Secondary | ICD-10-CM | POA: Diagnosis not present

## 2015-10-20 DIAGNOSIS — N189 Chronic kidney disease, unspecified: Secondary | ICD-10-CM | POA: Diagnosis not present

## 2015-10-20 DIAGNOSIS — I739 Peripheral vascular disease, unspecified: Secondary | ICD-10-CM | POA: Diagnosis not present

## 2015-10-20 DIAGNOSIS — I6523 Occlusion and stenosis of bilateral carotid arteries: Secondary | ICD-10-CM | POA: Diagnosis not present

## 2015-10-20 DIAGNOSIS — Z87891 Personal history of nicotine dependence: Secondary | ICD-10-CM | POA: Diagnosis not present

## 2015-10-20 DIAGNOSIS — Z7982 Long term (current) use of aspirin: Secondary | ICD-10-CM

## 2015-10-20 DIAGNOSIS — I129 Hypertensive chronic kidney disease with stage 1 through stage 4 chronic kidney disease, or unspecified chronic kidney disease: Secondary | ICD-10-CM | POA: Diagnosis not present

## 2015-10-20 DIAGNOSIS — N183 Chronic kidney disease, stage 3 (moderate): Secondary | ICD-10-CM | POA: Diagnosis not present

## 2015-10-20 DIAGNOSIS — Z79899 Other long term (current) drug therapy: Secondary | ICD-10-CM

## 2015-10-20 DIAGNOSIS — I6522 Occlusion and stenosis of left carotid artery: Secondary | ICD-10-CM | POA: Diagnosis not present

## 2015-10-20 HISTORY — PX: ENDARTERECTOMY: SHX5162

## 2015-10-20 HISTORY — PX: PATCH ANGIOPLASTY: SHX6230

## 2015-10-20 LAB — CBC
HCT: 37.7 % — ABNORMAL LOW (ref 39.0–52.0)
Hemoglobin: 11.9 g/dL — ABNORMAL LOW (ref 13.0–17.0)
MCH: 28.2 pg (ref 26.0–34.0)
MCHC: 31.6 g/dL (ref 30.0–36.0)
MCV: 89.3 fL (ref 78.0–100.0)
PLATELETS: 153 10*3/uL (ref 150–400)
RBC: 4.22 MIL/uL (ref 4.22–5.81)
RDW: 13.8 % (ref 11.5–15.5)
WBC: 7 10*3/uL (ref 4.0–10.5)

## 2015-10-20 LAB — CREATININE, SERUM
CREATININE: 1.46 mg/dL — AB (ref 0.61–1.24)
GFR calc Af Amer: 52 mL/min — ABNORMAL LOW (ref >60)
GFR calc non Af Amer: 45 mL/min — ABNORMAL LOW (ref >60)

## 2015-10-20 SURGERY — ENDARTERECTOMY, CAROTID
Anesthesia: General | Site: Neck | Laterality: Left

## 2015-10-20 MED ORDER — FENTANYL CITRATE (PF) 100 MCG/2ML IJ SOLN
INTRAMUSCULAR | Status: DC | PRN
  Administered 2015-10-20: 100 ug via INTRAVENOUS
  Administered 2015-10-20: 50 ug via INTRAVENOUS

## 2015-10-20 MED ORDER — SODIUM CHLORIDE 0.9 % IV SOLN
INTRAVENOUS | Status: DC | PRN
  Administered 2015-10-20: 500 mL

## 2015-10-20 MED ORDER — LISINOPRIL 20 MG PO TABS
20.0000 mg | ORAL_TABLET | Freq: Every day | ORAL | Status: DC
  Administered 2015-10-21: 20 mg via ORAL
  Filled 2015-10-20 (×2): qty 1

## 2015-10-20 MED ORDER — DEXTRAN 40 IN SALINE 10-0.9 % IV SOLN
INTRAVENOUS | Status: AC
  Filled 2015-10-20: qty 500

## 2015-10-20 MED ORDER — DEXTRAN 40 IN SALINE 10-0.9 % IV SOLN
INTRAVENOUS | Status: DC | PRN
  Administered 2015-10-20: 500 mL

## 2015-10-20 MED ORDER — POTASSIUM CHLORIDE CRYS ER 20 MEQ PO TBCR: 20.0000 meq | EXTENDED_RELEASE_TABLET | Freq: Every day | ORAL | Status: DC | PRN

## 2015-10-20 MED ORDER — PHENYLEPHRINE HCL 10 MG/ML IJ SOLN
10.0000 mg | INTRAVENOUS | Status: DC | PRN
  Administered 2015-10-20: 10 ug/min via INTRAVENOUS

## 2015-10-20 MED ORDER — DOCUSATE SODIUM 100 MG PO CAPS
100.0000 mg | ORAL_CAPSULE | Freq: Every day | ORAL | Status: DC
  Administered 2015-10-21: 100 mg via ORAL
  Filled 2015-10-20: qty 1

## 2015-10-20 MED ORDER — OXYCODONE-ACETAMINOPHEN 5-325 MG PO TABS: 1.0000 | ORAL_TABLET | ORAL | Status: DC | PRN

## 2015-10-20 MED ORDER — LACTATED RINGERS IV SOLN
INTRAVENOUS | Status: DC | PRN
  Administered 2015-10-20: 07:00:00 via INTRAVENOUS

## 2015-10-20 MED ORDER — ACETAMINOPHEN 325 MG RE SUPP: 325.0000 mg | RECTAL | Status: DC | PRN

## 2015-10-20 MED ORDER — LIDOCAINE-EPINEPHRINE (PF) 1 %-1:200000 IJ SOLN
INTRAMUSCULAR | Status: DC | PRN
  Administered 2015-10-20: 30 mL

## 2015-10-20 MED ORDER — SUGAMMADEX SODIUM 200 MG/2ML IV SOLN
INTRAVENOUS | Status: DC | PRN
Start: 2015-10-20 — End: 2015-10-20
  Administered 2015-10-20: 150 mg via INTRAVENOUS

## 2015-10-20 MED ORDER — PROTAMINE SULFATE 10 MG/ML IV SOLN
INTRAVENOUS | Status: DC | PRN
  Administered 2015-10-20: 30 mg via INTRAVENOUS

## 2015-10-20 MED ORDER — MAGNESIUM SULFATE 2 GM/50ML IV SOLN: 2.0000 g | Freq: Every day | INTRAVENOUS | Status: DC | PRN

## 2015-10-20 MED ORDER — PROPOFOL 10 MG/ML IV BOLUS
INTRAVENOUS | Status: AC
  Filled 2015-10-20: qty 20

## 2015-10-20 MED ORDER — MORPHINE SULFATE (PF) 2 MG/ML IV SOLN: 1.0000 mg | INTRAVENOUS | Status: DC | PRN

## 2015-10-20 MED ORDER — LIDOCAINE HCL (CARDIAC) 20 MG/ML IV SOLN
INTRAVENOUS | Status: DC | PRN
  Administered 2015-10-20: 80 mg via INTRAVENOUS

## 2015-10-20 MED ORDER — ROCURONIUM BROMIDE 100 MG/10ML IV SOLN
INTRAVENOUS | Status: DC | PRN
  Administered 2015-10-20: 10 mg via INTRAVENOUS
  Administered 2015-10-20: 50 mg via INTRAVENOUS

## 2015-10-20 MED ORDER — HYDRALAZINE HCL 20 MG/ML IJ SOLN: 5.0000 mg | INTRAMUSCULAR | Status: DC | PRN

## 2015-10-20 MED ORDER — CHLORHEXIDINE GLUCONATE CLOTH 2 % EX PADS: 6.0000 | MEDICATED_PAD | Freq: Once | CUTANEOUS | Status: DC

## 2015-10-20 MED ORDER — SUCCINYLCHOLINE CHLORIDE 200 MG/10ML IV SOSY
PREFILLED_SYRINGE | INTRAVENOUS | Status: AC
  Filled 2015-10-20: qty 10

## 2015-10-20 MED ORDER — ONDANSETRON HCL 4 MG/2ML IJ SOLN: 4.0000 mg | Freq: Four times a day (QID) | INTRAMUSCULAR | Status: DC | PRN

## 2015-10-20 MED ORDER — EPHEDRINE 5 MG/ML INJ
INTRAVENOUS | Status: AC
  Filled 2015-10-20: qty 10

## 2015-10-20 MED ORDER — AMLODIPINE BESYLATE 10 MG PO TABS
10.0000 mg | ORAL_TABLET | Freq: Every day | ORAL | Status: DC
Start: 2015-10-21 — End: 2015-10-21
  Administered 2015-10-21: 10 mg via ORAL
  Filled 2015-10-20 (×2): qty 1

## 2015-10-20 MED ORDER — ALUM & MAG HYDROXIDE-SIMETH 200-200-20 MG/5ML PO SUSP: 15.0000 mL | ORAL | Status: DC | PRN

## 2015-10-20 MED ORDER — SODIUM CHLORIDE 0.9 % IV SOLN
INTRAVENOUS | Status: DC
  Administered 2015-10-20: 12:00:00 via INTRAVENOUS

## 2015-10-20 MED ORDER — PANTOPRAZOLE SODIUM 40 MG PO TBEC
40.0000 mg | DELAYED_RELEASE_TABLET | Freq: Every day | ORAL | Status: DC
  Administered 2015-10-21: 40 mg via ORAL
  Filled 2015-10-20 (×2): qty 1

## 2015-10-20 MED ORDER — ONDANSETRON HCL 4 MG/2ML IJ SOLN
INTRAMUSCULAR | Status: DC | PRN
  Administered 2015-10-20: 4 mg via INTRAVENOUS

## 2015-10-20 MED ORDER — LIDOCAINE 2% (20 MG/ML) 5 ML SYRINGE
INTRAMUSCULAR | Status: AC
  Filled 2015-10-20: qty 5

## 2015-10-20 MED ORDER — FENTANYL CITRATE (PF) 250 MCG/5ML IJ SOLN
INTRAMUSCULAR | Status: AC
Start: 2015-10-20 — End: 2015-10-20
  Filled 2015-10-20: qty 5

## 2015-10-20 MED ORDER — PROMETHAZINE HCL 25 MG/ML IJ SOLN: 6.2500 mg | INTRAMUSCULAR | Status: DC | PRN

## 2015-10-20 MED ORDER — ENOXAPARIN SODIUM 40 MG/0.4ML ~~LOC~~ SOLN: 40.0000 mg | SUBCUTANEOUS | Status: DC

## 2015-10-20 MED ORDER — SODIUM CHLORIDE 0.9 % IV SOLN: INTRAVENOUS | Status: DC

## 2015-10-20 MED ORDER — ONDANSETRON HCL 4 MG/2ML IJ SOLN
INTRAMUSCULAR | Status: AC
  Filled 2015-10-20: qty 2

## 2015-10-20 MED ORDER — 0.9 % SODIUM CHLORIDE (POUR BTL) OPTIME
TOPICAL | Status: DC | PRN
  Administered 2015-10-20: 2000 mL

## 2015-10-20 MED ORDER — ACETAMINOPHEN 325 MG PO TABS: 325.0000 mg | ORAL_TABLET | ORAL | Status: DC | PRN

## 2015-10-20 MED ORDER — LABETALOL HCL 5 MG/ML IV SOLN: 10.0000 mg | INTRAVENOUS | Status: DC | PRN

## 2015-10-20 MED ORDER — DEXTROSE 5 % IV SOLN
INTRAVENOUS | Status: AC
  Filled 2015-10-20: qty 1.5

## 2015-10-20 MED ORDER — EPHEDRINE SULFATE 50 MG/ML IJ SOLN
INTRAMUSCULAR | Status: DC | PRN
  Administered 2015-10-20: 5 mg via INTRAVENOUS

## 2015-10-20 MED ORDER — SIMVASTATIN 20 MG PO TABS
20.0000 mg | ORAL_TABLET | Freq: Every day | ORAL | Status: DC
  Filled 2015-10-20 (×2): qty 1

## 2015-10-20 MED ORDER — HYDROMORPHONE HCL 1 MG/ML IJ SOLN: 0.2500 mg | INTRAMUSCULAR | Status: DC | PRN

## 2015-10-20 MED ORDER — LIDOCAINE-EPINEPHRINE (PF) 1 %-1:200000 IJ SOLN
INTRAMUSCULAR | Status: AC
  Filled 2015-10-20: qty 30

## 2015-10-20 MED ORDER — LIDOCAINE HCL (PF) 1 % IJ SOLN
INTRAMUSCULAR | Status: AC
  Filled 2015-10-20: qty 30

## 2015-10-20 MED ORDER — DEXTROSE 5 % IV SOLN
1.5000 g | Freq: Two times a day (BID) | INTRAVENOUS | Status: AC
  Administered 2015-10-20 – 2015-10-21 (×2): 1.5 g via INTRAVENOUS
  Filled 2015-10-20 (×2): qty 1.5

## 2015-10-20 MED ORDER — METOPROLOL TARTRATE 5 MG/5ML IV SOLN: 2.0000 mg | INTRAVENOUS | Status: DC | PRN

## 2015-10-20 MED ORDER — HEPARIN SODIUM (PORCINE) 1000 UNIT/ML IJ SOLN
INTRAMUSCULAR | Status: DC | PRN
  Administered 2015-10-20: 6000 [IU] via INTRAVENOUS

## 2015-10-20 MED ORDER — SODIUM CHLORIDE 0.9 % IV SOLN: 500.0000 mL | Freq: Once | INTRAVENOUS | Status: DC | PRN

## 2015-10-20 MED ORDER — ROCURONIUM BROMIDE 50 MG/5ML IV SOLN
INTRAVENOUS | Status: AC
  Filled 2015-10-20: qty 1

## 2015-10-20 MED ORDER — PROPOFOL 10 MG/ML IV BOLUS
INTRAVENOUS | Status: DC | PRN
  Administered 2015-10-20: 120 mg via INTRAVENOUS

## 2015-10-20 MED ORDER — ASPIRIN EC 81 MG PO TBEC
81.0000 mg | DELAYED_RELEASE_TABLET | Freq: Every day | ORAL | Status: DC
  Administered 2015-10-21: 81 mg via ORAL
  Filled 2015-10-20 (×2): qty 1

## 2015-10-20 MED ORDER — PHENOL 1.4 % MT LIQD: 1.0000 | OROMUCOSAL | Status: DC | PRN

## 2015-10-20 MED ORDER — GUAIFENESIN-DM 100-10 MG/5ML PO SYRP: 15.0000 mL | ORAL_SOLUTION | ORAL | Status: DC | PRN

## 2015-10-20 MED ORDER — DEXTROSE 5 % IV SOLN: 1.5000 g | INTRAVENOUS | Status: DC

## 2015-10-20 MED ORDER — PHENYLEPHRINE 40 MCG/ML (10ML) SYRINGE FOR IV PUSH (FOR BLOOD PRESSURE SUPPORT)
PREFILLED_SYRINGE | INTRAVENOUS | Status: AC
Start: 2015-10-20 — End: 2015-10-20
  Filled 2015-10-20: qty 10

## 2015-10-20 MED ORDER — SODIUM CHLORIDE 0.9 % IV SOLN
0.0125 ug/kg/min | INTRAVENOUS | Status: AC
  Administered 2015-10-20: .05 ug/kg/min via INTRAVENOUS
  Administered 2015-10-20: .07 ug/kg/min via INTRAVENOUS
  Administered 2015-10-20: .06 ug/kg/min via INTRAVENOUS
  Filled 2015-10-20: qty 2000

## 2015-10-20 SURGICAL SUPPLY — 46 items
BAG DECANTER FOR FLEXI CONT (MISCELLANEOUS) ×3 IMPLANT
CANISTER SUCTION 2500CC (MISCELLANEOUS) ×3 IMPLANT
CANNULA VESSEL 3MM 2 BLNT TIP (CANNULA) ×7 IMPLANT
CATH ROBINSON RED A/P 18FR (CATHETERS) ×3 IMPLANT
CLIP TI MEDIUM 24 (CLIP) ×3 IMPLANT
CLIP TI WIDE RED SMALL 24 (CLIP) ×3 IMPLANT
CRADLE DONUT ADULT HEAD (MISCELLANEOUS) ×3 IMPLANT
DRAIN CHANNEL 15F RND FF W/TCR (WOUND CARE) IMPLANT
ELECT REM PT RETURN 9FT ADLT (ELECTROSURGICAL) ×3
ELECTRODE REM PT RTRN 9FT ADLT (ELECTROSURGICAL) ×1 IMPLANT
EVACUATOR SILICONE 100CC (DRAIN) IMPLANT
GLOVE BIO SURGEON STRL SZ 6.5 (GLOVE) ×2 IMPLANT
GLOVE BIO SURGEON STRL SZ7.5 (GLOVE) ×3 IMPLANT
GLOVE BIO SURGEONS STRL SZ 6.5 (GLOVE) ×2
GLOVE BIOGEL PI IND STRL 6.5 (GLOVE) IMPLANT
GLOVE BIOGEL PI IND STRL 8 (GLOVE) ×1 IMPLANT
GLOVE BIOGEL PI INDICATOR 6.5 (GLOVE) ×4
GLOVE BIOGEL PI INDICATOR 8 (GLOVE) ×2
GLOVE SKINSENSE NS SZ7.0 (GLOVE) ×2
GLOVE SKINSENSE STRL SZ7.0 (GLOVE) IMPLANT
GOWN STRL REUS W/ TWL LRG LVL3 (GOWN DISPOSABLE) ×3 IMPLANT
GOWN STRL REUS W/TWL LRG LVL3 (GOWN DISPOSABLE) ×9
KIT BASIN OR (CUSTOM PROCEDURE TRAY) ×3 IMPLANT
KIT ROOM TURNOVER OR (KITS) ×3 IMPLANT
LIQUID BAND (GAUZE/BANDAGES/DRESSINGS) ×3 IMPLANT
NDL HYPO 25X1 1.5 SAFETY (NEEDLE) ×1 IMPLANT
NEEDLE HYPO 25X1 1.5 SAFETY (NEEDLE) ×3 IMPLANT
NS IRRIG 1000ML POUR BTL (IV SOLUTION) ×6 IMPLANT
PACK CAROTID (CUSTOM PROCEDURE TRAY) ×3 IMPLANT
PAD ARMBOARD 7.5X6 YLW CONV (MISCELLANEOUS) ×6 IMPLANT
PATCH VASC XENOSURE 1CMX6CM (Vascular Products) ×3 IMPLANT
PATCH VASC XENOSURE 1X6 (Vascular Products) IMPLANT
SHUNT CAROTID BYPASS 10 (VASCULAR PRODUCTS) IMPLANT
SHUNT CAROTID BYPASS 12 (VASCULAR PRODUCTS) ×2 IMPLANT
SHUNT CAROTID BYPASS 12FRX15.5 (VASCULAR PRODUCTS) IMPLANT
SPONGE INTESTINAL PEANUT (DISPOSABLE) ×3 IMPLANT
SPONGE SURGIFOAM ABS GEL 100 (HEMOSTASIS) IMPLANT
SUT PROLENE 6 0 BV (SUTURE) ×7 IMPLANT
SUT PROLENE 7 0 BV 1 (SUTURE) IMPLANT
SUT SILK 2 0 FS (SUTURE) IMPLANT
SUT VIC AB 3-0 SH 27 (SUTURE) ×3
SUT VIC AB 3-0 SH 27X BRD (SUTURE) ×1 IMPLANT
SUT VICRYL 4-0 PS2 18IN ABS (SUTURE) ×3 IMPLANT
SYR CONTROL 10ML LL (SYRINGE) ×3 IMPLANT
SYRINGE 20CC LL (MISCELLANEOUS) ×2 IMPLANT
WATER STERILE IRR 1000ML POUR (IV SOLUTION) ×3 IMPLANT

## 2015-10-20 NOTE — Progress Notes (Signed)
Pharmacy consulted for renal dose adjustment for cefuroxime, indicated for surgical ppx. Calculated CrCl is ~33 ml/min. Current cefuroxime dose of 1.5 gm every 12 hr x 3 total doses is appropriate and no further adjustments necessary.  Arrie Senate, PharmD PGY-1 Pharmacy Resident Pager: [phone] 10/20/2015

## 2015-10-20 NOTE — Progress Notes (Signed)
Care of pt assumed by MA Kristel Durkee RN 

## 2015-10-20 NOTE — Care Management Note (Signed)
Case Management Note  Patient Details  Name: Miguel Webb MRN: [id number] Date of Birth: March 19, 1941  Subjective/Objective:  Patient is from home, s/p LCEA, NCM will cont to follow for post op dc needs.                  Action/Plan:   Expected Discharge Date:  10/21/15               Expected Discharge Plan:  Home/Self Care  In-House Referral:     Discharge planning Services  CM Consult  Post Acute Care Choice:    Choice offered to:     DME Arranged:    DME Agency:     HH Arranged:    HH Agency:     Status of Service:  Completed, signed off  If discussed at [employer] of Stay Meetings, dates discussed:    Additional Comments:  Zenon Mayo, RN 10/20/2015, 5:35 PM

## 2015-10-20 NOTE — Interval H&P Note (Signed)
History and Physical Interval Note:  10/20/2015 7:09 AM  Miguel Webb  has presented today for surgery, with the diagnosis of Left carotid artery stenosis I65.22  The various methods of treatment have been discussed with the patient and family. After consideration of risks, benefits and other options for treatment, the patient has consented to  Procedure(s): ENDARTERECTOMY CAROTID (Left) as a surgical intervention .  The patient's history has been reviewed, patient examined, no change in status, stable for surgery.  I have reviewed the patient's chart and labs.  Questions were answered to the patient's satisfaction.     Deitra Mayo

## 2015-10-20 NOTE — Discharge Summary (Signed)
[facility] Discharge Summary  Miguel Webb 08-05-1940 75 y.o. male  [id number]  Admission Date: 10/20/2015  Discharge Date: 10/21/2015  Physician: [name], MD  Admission Diagnosis: Left carotid artery stenosis I65.22  HPI:   This is a 75 y.o. male who was referred for a carotid evaluation. The patient has a history of essential hypertension, hyperlipidemia, type 2 diabetes, and chronic kidney disease. The patient was found to have a left carotid bruit which prompted the carotid duplex scan.  A duplex scan showed a greater than 70% left carotid stenosis and he was sent for vascular consultation. He is right-handed. He denies any history of stroke, TIAs, expressive or receptive aphasia, or amaurosis fugax.  He denies any history of myocardial infarction, congestive heart failure, chest pain or significant shortness of breath. He is very active and works 2 part-time jobs. He is not a smoker.   Hospital Course:  The patient was admitted to the hospital and taken to the operating room on 10/20/2015 and underwent left carotid endarterectomy.  The patient tolerated the procedure well and was transported to the PACU in stable condition.  By POD 1, the patient's neuro status was intact. His left neck incision was clean and intact without hematoma. He was ambulating, voiding and tolerating a diet without difficulty. He was discharged home on POD 1 in good condition.     Recent Labs  10/18/15 1140  NA 141  K 4.3  CL 108  CO2 26  GLUCOSE 81  BUN 18  CALCIUM 9.7    Recent Labs  10/18/15 1140  WBC 5.3  HGB 14.5  HCT 44.2  PLT 198    Recent Labs  10/18/15 1140  INR 1.06    Discharge Instructions:   The patient is discharged to home with extensive instructions on wound care and progressive ambulation.  They are instructed not to drive or perform any heavy lifting until returning to see the physician in his office.  Discharge  Instructions    CAROTID Sugery: Call MD for difficulty swallowing or speaking; weakness in arms or legs that is a new symtom; severe headache.  If you have increased swelling in the neck and/or  are having difficulty breathing, CALL [phone]    Complete by:  As directed      Call MD for:  redness, tenderness, or signs of infection (pain, swelling, bleeding, redness, odor or green/yellow discharge around incision site)    Complete by:  As directed      Call MD for:  severe or increased pain, loss or decreased feeling  in affected limb(s)    Complete by:  As directed      Call MD for:  temperature >100.5    Complete by:  As directed      Discharge wound care:    Complete by:  As directed   Wash wound daily with soap and water and pat dry. Do not apply any creams or ointments on your incisions.     Driving Restrictions    Complete by:  As directed   No driving for 2 weeks     Increase activity slowly    Complete by:  As directed   Walk with assistance use walker or cane as needed     Lifting restrictions    Complete by:  As directed   No lifting for 2 weeks     Resume previous diet    Complete by:  As directed  Discharge Diagnosis:  Left carotid artery stenosis I65.22  Secondary Diagnosis: Patient Active Problem List   Diagnosis Date Noted  . Carotid stenosis, asymptomatic 10/20/2015   Past Medical History  Diagnosis Date  . Hypertension   . Peripheral vascular disease ([place])   . Hyperlipidemia   . Wears glasses   . Hypoglycemia   . Arthritis   . CKD (chronic kidney disease)       Medication List    TAKE these medications        amLODipine 10 MG tablet  Commonly known as:  NORVASC  Take 10 mg by mouth daily.     aspirin 81 MG tablet  Take 81 mg by mouth daily.     lisinopril 20 MG tablet  Commonly known as:  PRINIVIL,ZESTRIL  Take 20 mg by mouth daily.     oxyCODONE-acetaminophen 5-325 MG tablet  Commonly known as:  PERCOCET/ROXICET  Take 1-2 tablets  by mouth every 4 (four) hours as needed for moderate pain.     polyethylene glycol-electrolytes 420 g solution  Commonly known as:  TRILYTE  Take 4,000 mLs by mouth as directed.     simvastatin 20 MG tablet  Commonly known as:  ZOCOR  Take 20 mg by mouth daily.        Percocet #8 No Refill  Disposition: Home  Patient's condition: is Good  Follow up: 1. Dr.  Scot Dock in 2 weeks.   Virgina Jock, PA-C [facility] [phone]  --- For [facility] use --- Instructions: Press F2 to tab through selections.  Delete question if not applicable.   Modified Rankin score at D/C (0-6): 0  IV medication needed for:  1. Hypertension: No 2. Hypotension: No  Post-op Complications: No  1. Post-op CVA or TIA: No  2. CN injury: No  3. Myocardial infarction: No  4.  CHF: No  5.  Dysrhythmia (new): No  6. Wound infection: No  7. Reperfusion symptoms: No  8. Return to OR: No  Discharge medications: Statin use:  Yes If No: [ ]  For Medical reasons, [ ]  Non-compliant, [ ]  Not-indicated ASA use:  Yes  If No: [ ]  For Medical reasons, [ ]  Non-compliant, [ ]  Not-indicated Beta blocker use:  No If No: [ ]  For Medical reasons, [ ]  Non-compliant, [x]  Not-indicated ACE-Inhibitor use:  Yes If No: [ ]  For Medical reasons, [ ]  Non-compliant, [ ]  Not-indicated P2Y12 Antagonist use: No, [ ]  Plavix, [ ]  Plasugrel, [ ]  Ticlopinine, [ ]  Ticagrelor, [ ]  Other, [ ]  No for medical reason, [ ]  Non-compliant, [ x] Not-indicated Anti-coagulant use:  No, [ ]  Warfarin, [ ]  Rivaroxaban, [ ]  Dabigatran, [ ]  Other, [ ]  No for medical reason, [ ]  Non-compliant, [x ] Not-indicated

## 2015-10-20 NOTE — Anesthesia Postprocedure Evaluation (Signed)
Anesthesia Post Note  Patient: Miguel Webb  Procedure(s) Performed: Procedure(s) (LRB): Left CAROTID Endartectomy (Left) PATCH ANGIOPLASTY (Left)  Patient location during evaluation: PACU Anesthesia Type: General Level of consciousness: awake Pain management: pain level controlled Vital Signs Assessment: post-procedure vital signs reviewed and stable Respiratory status: spontaneous breathing Anesthetic complications: no    Last Vitals:  Filed Vitals:   10/20/15 1156 10/20/15 1200  BP: 101/52   Pulse:  64  Temp:    Resp:  13    Last Pain:  Filed Vitals:   10/20/15 1214  PainSc: 0-No pain                 EDWARDS,Yamaris Cummings

## 2015-10-20 NOTE — Transfer of Care (Signed)
Immediate Anesthesia Transfer of Care Note  Patient: Miguel Webb  Procedure(s) Performed: Procedure(s): Left CAROTID Endartectomy (Left) PATCH ANGIOPLASTY (Left)  Patient Location: PACU  Anesthesia Type:General  Level of Consciousness: awake, alert , oriented and patient cooperative  Airway & Oxygen Therapy: Patient Spontanous Breathing and Patient connected to nasal cannula oxygen  Post-op Assessment: Report given to RN and Post -op Vital signs reviewed and stable  Post vital signs: Reviewed and stable  Last Vitals:  Filed Vitals:   10/20/15 0558  BP: 136/72  Pulse: 73  Temp: 36.8 C  Resp: 18    Last Pain: There were no vitals filed for this visit.       Complications: No apparent anesthesia complications

## 2015-10-20 NOTE — Op Note (Signed)
    NAME: Miguel Webb   MRN: [id number] DOB: 05-10-1940    DATE OF OPERATION: 10/20/2015  PREOP DIAGNOSIS: asymptomatic greater than 80% left carotid stenosis  POSTOP DIAGNOSIS: same  PROCEDURE: left carotid endarterectomy with bovine pericardial patch angioplasty  SURGEON: Judeth Cornfield. Scot Dock, MD, FACS  ASSIST: Adele Barthel, MD, Silva Bandy, [facility]  ANESTHESIA: gen.   EBL: minimal  INDICATIONS: Miguel Webb is a 75 y.o. male who was found to have a left carotid bruit.This prompted a duplex scan which showed a greater than 80% left carotid stenosis. He was asymptomatic. There was a less than 50% right carotid stenosis.  FINDINGS: greater than 90% left carotid stenosis  TECHNIQUE: The patient was taken to the operating room and received a general anesthetic.An arterial line had been placed by anesthesia. After careful positioning, the left neck and upper chest were prepped and draped in the usual sterile fashion. An incision was made along the anterior border of the sternocleidomastoid and the dissection carried down to the common carotid artery which was dissected free and controlled with a Rummel tourniquet. The facial vein was divided between 2-0 silk ties. The internal carotid artery was controlled above the plaque. The patient had been heparinized with 6000 units of IV heparin. The superior thyroid artery and external carotid arteries were also controlled.  A clamp was then placed on the internal carotid artery, then the common carotid artery, then the external carotid artery. A longitudinal arteriotomy was made in the common carotid artery and this was extended through the plaque into the internal carotid artery above the plaque. A 12 shunt was placed into the internal carotid artery, backbled and then placed into the common carotid artery and secured with Rummel tourniquet. Flow was reestablished to the shunt. Flow through the shunt was checked with a Doppler. The shunt was patent.  An endarterectomy plane was established proximally and the plaque was sharply divided. Eversion endarterectomy was performed of the external carotid artery. Distally it was a nice taper and the plaque and no tacking sutures were required. The artery was irrigated with copious amounts of dextran and heparin and all loose debris removed.  A bovine pericardial patch was then sewn using continuous 6-0 Prolene suture. Prior to completing patch closure, the shunt was removed. The arteries were backbled and flushed appropriately and the anastomosis completed. Flow was reestablished first to the external carotid artery and into the internal carotid artery. At the completion was a good pulse distal to the patch and a good Doppler signal with diastolic flow. The heparin was partially reversed with protamine.  The wound was closed with a deep layer of 3-0 Vicryl. The platysma was closed with running 3-0 Vicryl. The skin was closed with a 4-0 Vicryl. Sterile dressing was applied. The patient tolerated the procedure well and was transferred to the recovery room in stable condition. All needle and sponge counts were correct. The patient awoke neurologically intact.  Deitra Mayo, MD, FACS [facility]  DATE OF DICTATION:   10/20/2015

## 2015-10-20 NOTE — Progress Notes (Addendum)
  [facility] Day of Surgery Note  Subjective:  Doing well. No complaints.   Filed Vitals:   10/20/15 1330 10/20/15 1339  BP:    Pulse: 71 77  Temp:  98.2 F (36.8 C)  Resp: 14 16   Left neck without hematoma 5/5 strength upper and lower extremities bilaterally. Tongue midline. Smile symmetric.    Assessment/Plan:  This is a 75 y.o. male who is s/p left carotid endarterectomy  Stable post-op.  Neuro exam intact.  Neck without hematoma.    Virgina Jock, [place] [phone] 10/20/2015 2:24 PM  I have interviewed the patient and examined the patient. I agree with the findings by the PA.  Gae Gallop, MD [phone]

## 2015-10-20 NOTE — Anesthesia Procedure Notes (Signed)
Procedure Name: Intubation Date/Time: 10/20/2015 7:41 AM Performed by: Shirlyn Goltz Pre-anesthesia Checklist: Patient identified, Emergency Drugs available, Suction available and Patient being monitored Patient Re-evaluated:Patient Re-evaluated prior to inductionOxygen Delivery Method: Circle system utilized Preoxygenation: Pre-oxygenation with 100% oxygen Intubation Type: IV induction Ventilation: Mask ventilation without difficulty and Oral airway inserted - appropriate to patient size Laryngoscope Size: Mac and 4 Grade View: Grade I Tube type: Oral Tube size: 7.5 mm Number of attempts: 1 Airway Equipment and Method: Stylet Placement Confirmation: ETT inserted through vocal cords under direct vision,  positive ETCO2 and breath sounds checked- equal and bilateral Secured at: 21 cm Tube secured with: Tape Dental Injury: Teeth and Oropharynx as per pre-operative assessment

## 2015-10-20 NOTE — H&P (View-Only) (Signed)
[facility] [place]  Patient name: Miguel Webb MRN: [id number] DOB: 08/13/1940 Sex: male  REASON FOR CONSULT: left carotid stenosis. Referred by Dr. Asencion Noble  HPI: Miguel Webb is a 74 y.o. male, who is referred for a carotid evaluation.  I have reviewed the records that were sent from Dr. Ria Comment office. The patient has a history of essential hypertension, hyperlipidemia, type 2 diabetes, and chronic kidney disease. The patient was found to have a left carotid bruit which prompted the carotid duplex scan.  A duplex scan showed a greater than 70% left carotid stenosis and he was sent for vascular consultation. He is right-handed. He denies any history of stroke, TIAs, expressive or receptive aphasia, or amaurosis fugax.  He denies any history of myocardial infarction, congestive heart failure, chest pain or significant shortness of breath. He is very active and works 2 part-time jobs. He is not a smoker.   Past Medical History  Diagnosis Date  . Hypertension   . Peripheral vascular disease ([place])     Family History  Problem Relation Age of Onset  . Stroke Father   . Breast cancer Sister   . Alzheimer's disease Brother   his brother had heart problems in his 29s.  SOCIAL HISTORY: Social History   Social History  . Marital Status: Married    Spouse Name: N/A  . Number of Children: N/A  . Years of Education: N/A   Occupational History  . Not on file.   Social History Main Topics  . Smoking status: Former Research scientist (life sciences)  . Smokeless tobacco: Not on file  . Alcohol Use: Yes     Comment: 2-3 times per week drinks a little vodka  . Drug Use: No  . Sexual Activity: Not on file   Other Topics Concern  . Not on file   Social History Narrative    No Known Allergies  Current Outpatient Prescriptions  Medication Sig Dispense Refill  . amLODipine (NORVASC) 10 MG tablet Take 10 mg by mouth daily.    Marland Kitchen aspirin 81 MG tablet Take 81 mg by mouth daily.     Marland Kitchen lisinopril (PRINIVIL,ZESTRIL) 20 MG tablet Take 20 mg by mouth daily.    . simvastatin (ZOCOR) 20 MG tablet Take 20 mg by mouth daily.    . polyethylene glycol-electrolytes (TRILYTE) 420 g solution Take 4,000 mLs by mouth as directed. (Patient not taking: Reported on 10/07/2015) 4000 mL 0   No current facility-administered medications for this visit.    REVIEW OF SYSTEMS:  [X]  denotes positive finding, [ ]  denotes negative finding Cardiac  Comments:  Chest pain or chest pressure:    Shortness of breath upon exertion:    Short of breath when lying flat:    Irregular heart rhythm:        Vascular    Pain in calf, thigh, or hip brought on by ambulation:    Pain in feet at night that wakes you up from your sleep:     Blood clot in your veins:    Leg swelling:         Pulmonary    Oxygen at home:    Productive cough:     Wheezing:         Neurologic    Sudden weakness in arms or legs:     Sudden numbness in arms or legs:     Sudden onset of difficulty speaking or slurred speech:    Temporary loss of vision  in one eye:     Problems with dizziness:         Gastrointestinal    Blood in stool:     Vomited blood:         Genitourinary    Burning when urinating:     Blood in urine:        Psychiatric    Major depression:         Hematologic    Bleeding problems:    Problems with blood clotting too easily:        Skin    Rashes or ulcers:        Constitutional    Fever or chills:      PHYSICAL EXAM: Filed Vitals:    GENERAL: The patient is a well-nourished male, in no acute distress. The vital signs are documented above. CARDIAC: There is a regular rate and rhythm.  VASCULAR: He has a left carotid bruit. He has a palpable left dorsalis pedis pulse. Otherwise I cannot palpate pedal pulses however both feet are warm and well perfused. PULMONARY: There is good air exchange bilaterally without wheezing or rales. ABDOMEN: Soft and non-tender with normal pitched  bowel sounds.  MUSCULOSKELETAL: There are no major deformities or cyanosis. NEUROLOGIC: No focal weakness or paresthesias are detected. SKIN: There are no ulcers or rashes noted. PSYCHIATRIC: The patient has a normal affect.  DATA:   CAROTID DUPLEX: I have reviewed the carotid duplex can that was done on 09/20/2015. This was done at [facility]. There was a less than 50% right carotid stenosis. On the left side, there was moderate to severe irregular plaque consistent with a greater than 70% stenosis.  On the left side, the peak systolic velocity was 99991111 cm/s with end-diastolic loss fever XX123456 cm/s. The ICA to CCA ratio is 4.3. Based on his peak systolic velocity in the left internal carotid artery, and the ICA to CCA ratio, I think the stenosis on the left is greater than 80%.  GFR on 09/05/2015 was 54. Creatinine was 1.44 hemoglobin A1c was 6.0  MEDICAL ISSUES:  GREATER THAN 80% LEFT CAROTID STENOSIS: This patient has a greater than 80% left carotid stenosis. He is asymptomatic. I have recommended left carotid endarterectomy. I have reviewed the indications for carotid endarterectomy, that is to lower the risk of future stroke. I have also reviewed the potential complications of surgery, including but not limited to: bleeding, stroke (perioperative risk 1-2%), MI, nerve injury of other unpredictable medical problems. All of the patients questions were answered and they are agreeable to proceed with surgery. He will continue his aspirin right up to surgery. He is on a statin also. His surgery is scheduled for 10/20/2015.     Deitra Mayo [facility] [place] [phone]

## 2015-10-20 NOTE — Anesthesia Preprocedure Evaluation (Addendum)
Anesthesia Evaluation  Patient identified by MRN, date of birth, ID band Patient awake    Reviewed: Allergy & Precautions, NPO status , Patient's Chart, lab work & pertinent test results  Airway Mallampati: II  TM Distance: >3 FB     Dental   Pulmonary former smoker,    breath sounds clear to auscultation       Cardiovascular hypertension, + Peripheral Vascular Disease   Rhythm:Regular Rate:Normal     Neuro/Psych    GI/Hepatic negative GI ROS, Neg liver ROS,   Endo/Other  negative endocrine ROS  Renal/GU Renal disease     Musculoskeletal  (+) Arthritis ,   Abdominal   Peds  Hematology   Anesthesia Other Findings   Reproductive/Obstetrics                          Anesthesia Physical Anesthesia Plan  ASA: II  Anesthesia Plan: General   Post-op Pain Management:    Induction: Intravenous  Airway Management Planned: Oral ETT  Additional Equipment: Arterial line  Intra-op Plan:   Post-operative Plan: Extubation in OR  Informed Consent: I have reviewed the patients History and Physical, chart, labs and discussed the procedure including the risks, benefits and alternatives for the proposed anesthesia with the patient or authorized representative who has indicated his/her understanding and acceptance.   Dental advisory given  Plan Discussed with: CRNA and Anesthesiologist  Anesthesia Plan Comments:        Anesthesia Quick Evaluation

## 2015-10-21 ENCOUNTER — Telehealth: Payer: Self-pay | Admitting: Vascular Surgery

## 2015-10-21 ENCOUNTER — Encounter (HOSPITAL_COMMUNITY): Payer: Self-pay | Admitting: Vascular Surgery

## 2015-10-21 LAB — BASIC METABOLIC PANEL
ANION GAP: 7 (ref 5–15)
BUN: 12 mg/dL (ref 6–20)
CHLORIDE: 107 mmol/L (ref 101–111)
CO2: 24 mmol/L (ref 22–32)
Calcium: 8.5 mg/dL — ABNORMAL LOW (ref 8.9–10.3)
Creatinine, Ser: 1.34 mg/dL — ABNORMAL HIGH (ref 0.61–1.24)
GFR calc non Af Amer: 50 mL/min — ABNORMAL LOW (ref >60)
GFR, EST AFRICAN AMERICAN: 58 mL/min — AB (ref >60)
GLUCOSE: 95 mg/dL (ref 65–99)
Potassium: 4.1 mmol/L (ref 3.5–5.1)
Sodium: 138 mmol/L (ref 135–145)

## 2015-10-21 LAB — CBC
HEMATOCRIT: 36.2 % — AB (ref 39.0–52.0)
HEMOGLOBIN: 11.7 g/dL — AB (ref 13.0–17.0)
MCH: 28.6 pg (ref 26.0–34.0)
MCHC: 32.3 g/dL (ref 30.0–36.0)
MCV: 88.5 fL (ref 78.0–100.0)
Platelets: 158 10*3/uL (ref 150–400)
RBC: 4.09 MIL/uL — ABNORMAL LOW (ref 4.22–5.81)
RDW: 13.6 % (ref 11.5–15.5)
WBC: 6.4 10*3/uL (ref 4.0–10.5)

## 2015-10-21 MED ORDER — PNEUMOCOCCAL VAC POLYVALENT 25 MCG/0.5ML IJ INJ: 0.5000 mL | INJECTION | INTRAMUSCULAR | Status: DC

## 2015-10-21 MED ORDER — DEXTROSE 5 % IV SOLN
1.5000 g | INTRAVENOUS | Status: DC | PRN
  Administered 2015-10-20: 1.5 g via INTRAVENOUS

## 2015-10-21 NOTE — Care Management Important Message (Signed)
Important Message  Patient Details  Name: JOSHA DENOBLE MRN: [id number] Date of Birth: [birth year]   Medicare Important Message Given:  Yes    Loann Quill 10/21/2015, 10:20 AM

## 2015-10-21 NOTE — Telephone Encounter (Signed)
-----   Message from Denman George, RN sent at 10/20/2015  5:08 PM EDT ----- Regarding: needs 2 week f/u with [facility]   ----- Message -----    From: Alvia Grove, PA-C    Sent: 10/20/2015   2:26 PM      To: Vvs Charge Pool  S/p left CEA 10/20/15  F/u with [facility] in 2 weeks.  Thanks Maudie Mercury

## 2015-10-21 NOTE — Telephone Encounter (Signed)
Sched appt 8/10 at 11:15. Spoke to pt's son to inform them of appt.

## 2015-10-21 NOTE — Progress Notes (Addendum)
  [facility]ists Progress Note  Subjective  - POD #1  No complaints.   Objective Filed Vitals:   10/21/15 0000 10/21/15 0400  BP: 121/68 117/60  Pulse: 78 70  Temp:  98.3 F (36.8 C)  Resp: 16 13    Intake/Output Summary (Last 24 hours) at 10/21/15 0724 Last data filed at 10/21/15 0600  Gross per 24 hour  Intake 3372.5 ml  Output   1350 ml  Net 2022.5 ml   Left neck incision intact. No hematoma. Moving all extremities equally. No facial droop. Tongue midline.   Assessment/Planning: 75 y.o. male is s/p: left carotid endarterectomy 1 Day Post-Op   Neuro exam intact. Neck without hematoma. Voiding, ambulating and tolerating diet.  D/c home today.  Modified rankin: 0 VQI: on ASA and statin.   Alvia Grove 10/21/2015 7:24 AM --  Laboratory CBC    Component Value Date/Time   WBC 6.4 10/21/2015 0400   HGB 11.7* 10/21/2015 0400   HCT 36.2* 10/21/2015 0400   PLT 158 10/21/2015 0400    BMET    Component Value Date/Time   NA 138 10/21/2015 0400   K 4.1 10/21/2015 0400   CL 107 10/21/2015 0400   CO2 24 10/21/2015 0400   GLUCOSE 95 10/21/2015 0400   BUN 12 10/21/2015 0400   CREATININE 1.34* 10/21/2015 0400   CALCIUM 8.5* 10/21/2015 0400   GFRNONAA 50* 10/21/2015 0400   GFRAA 58* 10/21/2015 0400    COAG Lab Results  Component Value Date   INR 1.06 10/18/2015   No results found for: PTT  Antibiotics Anti-infectives    Start     Dose/Rate Route Frequency Ordered Stop   10/20/15 1430  cefUROXime (ZINACEF) 1.5 g in dextrose 5 % 50 mL IVPB     1.5 g 100 mL/hr over 30 Minutes Intravenous Every 12 hours 10/20/15 1411 10/21/15 0442   10/20/15 0609  dextrose 5 % with cefUROXime (ZINACEF) ADS Med    Comments:  Ardine Eng   : cabinet override      10/20/15 0609 10/20/15 1814   10/20/15 0604  cefUROXime (ZINACEF) 1.5 g in dextrose 5 % 50 mL IVPB  Status:  Discontinued     1.5 g 100 mL/hr over 30 Minutes Intravenous 30 min pre-op  10/20/15 0604 10/20/15 Niland, PA-C [facility] Office: [phone] Pager: [phone] 10/21/2015 7:24 AM   I have interviewed the patient and examined the patient. I agree with the findings by the PA.  Gae Gallop, MD [phone]

## 2015-10-21 NOTE — Progress Notes (Signed)
Discharge Note. Discharge education and instructions done with patient and family at the bedside. Reviewed all medications with patient including new Rx. Went over restrictions and how to care for the surgical site. Follow up appointment discussed. PIVs removed without any complications or issues. Patient ready for discharge.

## 2015-10-21 NOTE — Addendum Note (Signed)
Addendum  created 10/21/15 1017 by Josephine Igo, CRNA   Modules edited: Anesthesia Medication Administration

## 2015-10-28 ENCOUNTER — Telehealth: Payer: Self-pay

## 2015-10-28 NOTE — Telephone Encounter (Signed)
rec'd phone call from family member regarding swelling of left neck.  Call placed to pt.  Questioned pt. About sx's.  Reported he has some swelling at lower end of and below left neck incision.  Reported he thinks it is improving.  Denied any difficulty swallowing, headache, or fever/ chills.  Stated he can turn his head better.  Denied any pulsation with gentle palpation of the area of swelling of left neck region.  Stated "I think I'm doing pretty good."  Advised to call office with any further concerns.  Verb. Understanding.

## 2015-11-03 ENCOUNTER — Encounter: Payer: Self-pay | Admitting: Vascular Surgery

## 2015-11-04 ENCOUNTER — Telehealth: Payer: Self-pay

## 2015-11-04 NOTE — Telephone Encounter (Signed)
Pt. Notified that Dr. Scot Dock has approved he can return to his usual duties at the [employer].  Per pt. Request  will mail a return to work note to his home.

## 2015-11-04 NOTE — Telephone Encounter (Signed)
-----   Message from [name], MD sent at 11/04/2015  9:46 AM EDT ----- Regarding: RE: request to return to work Yes. Thanks  ----- Message ----- From: Denman George, RN Sent: 11/03/2015   3:31 PM To: [name], MD Subject: request to return to work                      He is 2 weeks post left CEA (7/20).  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  Stated no heavy lifting.  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  Denies taking any pain medications.  His f/u is 11/10/15.  Please advise.

## 2015-11-10 ENCOUNTER — Ambulatory Visit (INDEPENDENT_AMBULATORY_CARE_PROVIDER_SITE_OTHER): Payer: Medicare Other | Admitting: Vascular Surgery

## 2015-11-10 ENCOUNTER — Encounter: Payer: Self-pay | Admitting: Vascular Surgery

## 2015-11-10 VITALS — BP 113/72 | HR 87 | Temp 97.3°F | Resp 16 | Ht 69.5 in | Wt 130.0 lb

## 2015-11-10 DIAGNOSIS — I6529 Occlusion and stenosis of unspecified carotid artery: Secondary | ICD-10-CM

## 2015-11-10 DIAGNOSIS — Z48812 Encounter for surgical aftercare following surgery on the circulatory system: Secondary | ICD-10-CM

## 2015-11-10 NOTE — Progress Notes (Signed)
   Patient name: Miguel Webb MRN: [id number] DOB: Aug 27, 1940 Sex: male  REASON FOR VISIT: Follow up after left carotid endarterectomy with bovine pericardial patch angioplasty.  HPI: Miguel Webb is a 75 y.o. male we was found to have a left carotid bruit. This prompted a duplex scan which showed a greater than 80% left carotid stenosis. Left carotid endarterectomy was recommended in order to lower his risk of future stroke. He underwent left carotid endarterectomy on 10/20/2015. He returns for his first outpatient visit.  He denies any specific complaints. He denies focal weakness or paresthesias. He denies any problems swallowing.  Of note preoperative carotid duplex scan showed a less than 50% right carotid stenosis.  He is on aspirin and is on a statin.  Current Outpatient Prescriptions  Medication Sig Dispense Refill  . amLODipine (NORVASC) 10 MG tablet Take 10 mg by mouth daily.    Marland Kitchen aspirin 81 MG tablet Take 81 mg by mouth daily.    Marland Kitchen lisinopril (PRINIVIL,ZESTRIL) 20 MG tablet Take 20 mg by mouth daily.    . simvastatin (ZOCOR) 20 MG tablet Take 20 mg by mouth daily.    Marland Kitchen oxyCODONE-acetaminophen (PERCOCET/ROXICET) 5-325 MG tablet Take 1-2 tablets by mouth every 4 (four) hours as needed for moderate pain. (Patient not taking: Reported on 11/10/2015) 8 tablet 0  . polyethylene glycol-electrolytes (TRILYTE) 420 g solution Take 4,000 mLs by mouth as directed. (Patient not taking: Reported on 10/07/2015) 4000 mL 0   No current facility-administered medications for this visit.     REVIEW OF SYSTEMS:  [X]  denotes positive finding, [ ]  denotes negative finding Cardiac  Comments:  Chest pain or chest pressure:    Shortness of breath upon exertion:    Short of breath when lying flat:    Irregular heart rhythm:    Constitutional    Fever or chills:      PHYSICAL EXAM: Vitals:   11/10/15 1104 11/10/15 1106  BP: 121/76 113/72  Pulse: 87 87  Resp: 16   Temp: 97.3 F (36.3 C)    SpO2: 99%   Weight: 130 lb (59 kg)   Height: 5' 9.5" (1.765 m)     GENERAL: The patient is a well-nourished male, in no acute distress. The vital signs are documented above. CARDIOVASCULAR: There is a regular rate and rhythm. PULMONARY: There is good air exchange bilaterally without wheezing or rales. His incision is healing nicely. NEURO: He has no focal weakness or paresthesias.  MEDICAL ISSUES:  STATUS POST LEFT CAROTID ENDARTERECTOMY: The patient is doing well status post left carotid endarterectomy. He is on aspirin and is on a statin. He is not a smoker. He has a 50% right carotid stenosis and I have ordered a follow up carotid duplex scan in 6 months. I'll see him back at that time. He knows to call sooner if he has problems.  Deitra Mayo [facility] [place] [phone]

## 2015-12-07 ENCOUNTER — Other Ambulatory Visit: Payer: Self-pay | Admitting: Vascular Surgery

## 2015-12-07 NOTE — Addendum Note (Signed)
Addended by: Kaleen Mask on: 12/07/2015 02:59 PM   Modules accepted: Orders

## 2015-12-15 ENCOUNTER — Encounter (HOSPITAL_COMMUNITY): Payer: Self-pay

## 2016-01-09 DIAGNOSIS — N189 Chronic kidney disease, unspecified: Secondary | ICD-10-CM | POA: Diagnosis not present

## 2016-01-09 DIAGNOSIS — I10 Essential (primary) hypertension: Secondary | ICD-10-CM | POA: Diagnosis not present

## 2016-01-09 DIAGNOSIS — E119 Type 2 diabetes mellitus without complications: Secondary | ICD-10-CM | POA: Diagnosis not present

## 2016-01-09 DIAGNOSIS — E785 Hyperlipidemia, unspecified: Secondary | ICD-10-CM | POA: Diagnosis not present

## 2016-01-09 DIAGNOSIS — Z79899 Other long term (current) drug therapy: Secondary | ICD-10-CM | POA: Diagnosis not present

## 2016-01-17 ENCOUNTER — Other Ambulatory Visit (HOSPITAL_COMMUNITY): Payer: Self-pay | Admitting: Internal Medicine

## 2016-01-17 DIAGNOSIS — Z23 Encounter for immunization: Secondary | ICD-10-CM | POA: Diagnosis not present

## 2016-01-17 DIAGNOSIS — E041 Nontoxic single thyroid nodule: Secondary | ICD-10-CM | POA: Diagnosis not present

## 2016-01-17 DIAGNOSIS — I10 Essential (primary) hypertension: Secondary | ICD-10-CM | POA: Diagnosis not present

## 2016-01-17 DIAGNOSIS — N183 Chronic kidney disease, stage 3 (moderate): Secondary | ICD-10-CM | POA: Diagnosis not present

## 2016-01-17 DIAGNOSIS — E1122 Type 2 diabetes mellitus with diabetic chronic kidney disease: Secondary | ICD-10-CM | POA: Diagnosis not present

## 2016-02-07 ENCOUNTER — Ambulatory Visit (HOSPITAL_COMMUNITY)
Admission: RE | Admit: 2016-02-07 | Discharge: 2016-02-07 | Disposition: A | Discharge status: Home / Self Care | Payer: Medicare Other | Source: Ambulatory Visit | Attending: Internal Medicine | Admitting: Internal Medicine

## 2016-02-07 DIAGNOSIS — E041 Nontoxic single thyroid nodule: Secondary | ICD-10-CM

## 2016-02-07 DIAGNOSIS — Z85850 Personal history of malignant neoplasm of thyroid: Secondary | ICD-10-CM | POA: Diagnosis not present

## 2016-05-10 ENCOUNTER — Encounter: Payer: Self-pay | Admitting: Vascular Surgery

## 2016-05-14 DIAGNOSIS — N181 Chronic kidney disease, stage 1: Secondary | ICD-10-CM | POA: Diagnosis not present

## 2016-05-14 DIAGNOSIS — E119 Type 2 diabetes mellitus without complications: Secondary | ICD-10-CM | POA: Diagnosis not present

## 2016-05-14 DIAGNOSIS — Z79899 Other long term (current) drug therapy: Secondary | ICD-10-CM | POA: Diagnosis not present

## 2016-05-14 DIAGNOSIS — E785 Hyperlipidemia, unspecified: Secondary | ICD-10-CM | POA: Diagnosis not present

## 2016-05-14 DIAGNOSIS — I10 Essential (primary) hypertension: Secondary | ICD-10-CM | POA: Diagnosis not present

## 2016-05-16 ENCOUNTER — Encounter: Payer: Self-pay | Admitting: Vascular Surgery

## 2016-05-16 ENCOUNTER — Ambulatory Visit (HOSPITAL_COMMUNITY)
Admission: RE | Admit: 2016-05-16 | Discharge: 2016-05-16 | Disposition: A | Discharge status: Home / Self Care | Payer: Medicare Other | Source: Ambulatory Visit | Attending: Vascular Surgery | Admitting: Vascular Surgery

## 2016-05-16 ENCOUNTER — Ambulatory Visit (INDEPENDENT_AMBULATORY_CARE_PROVIDER_SITE_OTHER): Payer: Medicare Other | Admitting: Vascular Surgery

## 2016-05-16 VITALS — BP 130/75 | HR 76 | Temp 98.5°F | Resp 16 | Ht 69.5 in | Wt 127.0 lb

## 2016-05-16 DIAGNOSIS — I6522 Occlusion and stenosis of left carotid artery: Secondary | ICD-10-CM | POA: Diagnosis not present

## 2016-05-16 DIAGNOSIS — Z48812 Encounter for surgical aftercare following surgery on the circulatory system: Secondary | ICD-10-CM | POA: Diagnosis not present

## 2016-05-16 DIAGNOSIS — I6521 Occlusion and stenosis of right carotid artery: Secondary | ICD-10-CM | POA: Diagnosis not present

## 2016-05-16 DIAGNOSIS — I6529 Occlusion and stenosis of unspecified carotid artery: Secondary | ICD-10-CM | POA: Diagnosis not present

## 2016-05-16 LAB — VAS US CAROTID
LCCADSYS: -80 cm/s
LCCAPDIAS: 12 cm/s
LCCAPSYS: 68 cm/s
LEFT ECA DIAS: -9 cm/s
LEFT VERTEBRAL DIAS: -15 cm/s
Left CCA dist dias: -11 cm/s
Left ICA dist dias: -18 cm/s
Left ICA dist sys: -58 cm/s
Left ICA prox dias: -12 cm/s
Left ICA prox sys: -41 cm/s
RCCADSYS: -49 cm/s
RCCAPDIAS: 9 cm/s
RIGHT CCA MID DIAS: 15 cm/s
Right CCA prox sys: 66 cm/s

## 2016-05-16 NOTE — Progress Notes (Signed)
Patient name: Miguel Webb MRN: [id number] DOB: 08-28-1940 Sex: male  REASON FOR VISIT: Follow up after left carotid endarterectomy.  HPI: Miguel Webb is a 76 y.o. male who underwent a left carotid endarterectomy for an asymptomatic greater than 80% left carotid stenosis on 10/20/2015. He comes in today for 6 month follow up visit. Since I saw him last, he denies any history of stroke, TIAs, expressive or receptive aphasia, or amaurosis fugax.  Past Medical History:  Diagnosis Date  . Arthritis   . CKD (chronic kidney disease)   . Hyperlipidemia   . Hypertension   . Hypoglycemia   . Peripheral vascular disease ([place])   . Wears glasses     Family History  Problem Relation Age of Onset  . Stroke Father   . Breast cancer Sister   . Alzheimer's disease Brother     SOCIAL HISTORY: Social History  Substance Use Topics  . Smoking status: Former Smoker    Types: Cigarettes, Pipe  . Smokeless tobacco: Former Systems developer    Types: Chew     Comment: Quit smoking cigarettes and Pipe 28-30 years ago  . Alcohol use Yes     Comment: 1-2 times per week drinks a little vodka    No Known Allergies  Current Outpatient Prescriptions  Medication Sig Dispense Refill  . amLODipine (NORVASC) 10 MG tablet Take 10 mg by mouth daily.    Marland Kitchen aspirin 81 MG tablet Take 81 mg by mouth daily.    Marland Kitchen lisinopril (PRINIVIL,ZESTRIL) 20 MG tablet Take 20 mg by mouth daily.    . polyethylene glycol-electrolytes (TRILYTE) 420 g solution Take 4,000 mLs by mouth as directed. 4000 mL 0  . simvastatin (ZOCOR) 20 MG tablet Take 20 mg by mouth daily.     No current facility-administered medications for this visit.     REVIEW OF SYSTEMS:  [X]  denotes positive finding, [ ]  denotes negative finding Cardiac  Comments:  Chest pain or chest pressure:    Shortness of breath upon exertion:    Short of breath when lying flat:    Irregular heart rhythm:        Vascular    Pain in calf, thigh, or hip brought on  by ambulation:    Pain in feet at night that wakes you up from your sleep:     Blood clot in your veins:    Leg swelling:         Pulmonary    Oxygen at home:    Productive cough:     Wheezing:         Neurologic    Sudden weakness in arms or legs:     Sudden numbness in arms or legs:  X Occasional right hand numbness which she says is positional.   Sudden onset of difficulty speaking or slurred speech:    Temporary loss of vision in one eye:     Problems with dizziness:         Gastrointestinal    Blood in stool:     Vomited blood:         Genitourinary    Burning when urinating:     Blood in urine:        Psychiatric    Major depression:         Hematologic    Bleeding problems:    Problems with blood clotting too easily:        Skin    Rashes or ulcers:  Constitutional    Fever or chills:      PHYSICAL EXAM: Vitals:   05/16/16 1234 05/16/16 1238  BP: 130/78 130/75  Pulse: 80 76  Resp:  16  Temp:  98.5 F (36.9 C)  TempSrc:  Oral  SpO2:  95%  Weight:  127 lb (57.6 kg)  Height:  5' 9.5" (1.765 m)    GENERAL: The patient is a well-nourished male, in no acute distress. The vital signs are documented above. CARDIAC: There is a regular rate and rhythm.  VASCULAR: I do not detect carotid bruits. I cannot palpate pedal pulses however both feet are warm and well-perfused. He has no significant lower extremity swelling. PULMONARY: There is good air exchange bilaterally without wheezing or rales. ABDOMEN: Soft and non-tender with normal pitched bowel sounds.  MUSCULOSKELETAL: There are no major deformities or cyanosis. NEUROLOGIC: No focal weakness or paresthesias are detected. SKIN: There are no ulcers or rashes noted. PSYCHIATRIC: The patient has a normal affect.  DATA:   CAROTID DUPLEX: I have independently interpreted his carotid duplex scan today.  On the left side, the left carotid endarterectomy site is widely patent without evidence of  restenosis.  On the right side there is a less than 40% stenosis.  MEDICAL ISSUES:  STATUS POST LEFT CAROTID ENDARTERECTOMY: The patient is doing well status post left carotid endarterectomy. Duplex shows no evidence of recurrent stenosis. The right side has no significant stenosis. He is on aspirin and is on a statin. I have recommended a follow up carotid duplex scan in 1 year and I'll see him back that time. He knows to call sooner if he has problems.   Deitra Mayo [facility] [place] [phone]

## 2016-05-21 DIAGNOSIS — N183 Chronic kidney disease, stage 3 (moderate): Secondary | ICD-10-CM | POA: Diagnosis not present

## 2016-05-21 DIAGNOSIS — I10 Essential (primary) hypertension: Secondary | ICD-10-CM | POA: Diagnosis not present

## 2016-05-21 DIAGNOSIS — E1122 Type 2 diabetes mellitus with diabetic chronic kidney disease: Secondary | ICD-10-CM | POA: Diagnosis not present

## 2016-05-24 DIAGNOSIS — S93332A Other subluxation of left foot, initial encounter: Secondary | ICD-10-CM | POA: Diagnosis not present

## 2016-05-24 DIAGNOSIS — S93331A Other subluxation of right foot, initial encounter: Secondary | ICD-10-CM | POA: Diagnosis not present

## 2016-05-24 DIAGNOSIS — M79671 Pain in right foot: Secondary | ICD-10-CM | POA: Diagnosis not present

## 2016-05-24 DIAGNOSIS — M79672 Pain in left foot: Secondary | ICD-10-CM | POA: Diagnosis not present

## 2016-05-24 NOTE — Addendum Note (Signed)
Addended by: Lianne Cure A on: 05/24/2016 12:27 PM   Modules accepted: Orders

## 2016-09-10 DIAGNOSIS — M109 Gout, unspecified: Secondary | ICD-10-CM | POA: Diagnosis not present

## 2016-09-10 DIAGNOSIS — Z79899 Other long term (current) drug therapy: Secondary | ICD-10-CM | POA: Diagnosis not present

## 2016-09-10 DIAGNOSIS — N183 Chronic kidney disease, stage 3 (moderate): Secondary | ICD-10-CM | POA: Diagnosis not present

## 2016-09-10 DIAGNOSIS — E119 Type 2 diabetes mellitus without complications: Secondary | ICD-10-CM | POA: Diagnosis not present

## 2016-09-10 DIAGNOSIS — I10 Essential (primary) hypertension: Secondary | ICD-10-CM | POA: Diagnosis not present

## 2016-09-17 DIAGNOSIS — E1122 Type 2 diabetes mellitus with diabetic chronic kidney disease: Secondary | ICD-10-CM | POA: Diagnosis not present

## 2016-09-17 DIAGNOSIS — N183 Chronic kidney disease, stage 3 (moderate): Secondary | ICD-10-CM | POA: Diagnosis not present

## 2016-09-17 DIAGNOSIS — I10 Essential (primary) hypertension: Secondary | ICD-10-CM | POA: Diagnosis not present

## 2017-01-01 DIAGNOSIS — Z23 Encounter for immunization: Secondary | ICD-10-CM | POA: Diagnosis not present

## 2017-01-14 DIAGNOSIS — I10 Essential (primary) hypertension: Secondary | ICD-10-CM | POA: Diagnosis not present

## 2017-01-14 DIAGNOSIS — M109 Gout, unspecified: Secondary | ICD-10-CM | POA: Diagnosis not present

## 2017-01-14 DIAGNOSIS — E1129 Type 2 diabetes mellitus with other diabetic kidney complication: Secondary | ICD-10-CM | POA: Diagnosis not present

## 2017-01-14 DIAGNOSIS — N182 Chronic kidney disease, stage 2 (mild): Secondary | ICD-10-CM | POA: Diagnosis not present

## 2017-01-14 DIAGNOSIS — Z79899 Other long term (current) drug therapy: Secondary | ICD-10-CM | POA: Diagnosis not present

## 2017-01-21 DIAGNOSIS — N183 Chronic kidney disease, stage 3 (moderate): Secondary | ICD-10-CM | POA: Diagnosis not present

## 2017-01-21 DIAGNOSIS — E1122 Type 2 diabetes mellitus with diabetic chronic kidney disease: Secondary | ICD-10-CM | POA: Diagnosis not present

## 2017-01-21 DIAGNOSIS — I10 Essential (primary) hypertension: Secondary | ICD-10-CM | POA: Diagnosis not present

## 2017-05-09 DIAGNOSIS — I10 Essential (primary) hypertension: Secondary | ICD-10-CM | POA: Diagnosis not present

## 2017-05-09 DIAGNOSIS — N183 Chronic kidney disease, stage 3 (moderate): Secondary | ICD-10-CM | POA: Diagnosis not present

## 2017-05-09 DIAGNOSIS — E1129 Type 2 diabetes mellitus with other diabetic kidney complication: Secondary | ICD-10-CM | POA: Diagnosis not present

## 2017-05-14 DIAGNOSIS — E1129 Type 2 diabetes mellitus with other diabetic kidney complication: Secondary | ICD-10-CM | POA: Diagnosis not present

## 2017-05-14 DIAGNOSIS — I10 Essential (primary) hypertension: Secondary | ICD-10-CM | POA: Diagnosis not present

## 2017-05-14 DIAGNOSIS — N183 Chronic kidney disease, stage 3 (moderate): Secondary | ICD-10-CM | POA: Diagnosis not present

## 2017-05-22 ENCOUNTER — Ambulatory Visit (INDEPENDENT_AMBULATORY_CARE_PROVIDER_SITE_OTHER): Payer: Medicare Other | Admitting: Vascular Surgery

## 2017-05-22 ENCOUNTER — Ambulatory Visit (HOSPITAL_COMMUNITY)
Admission: RE | Admit: 2017-05-22 | Discharge: 2017-05-22 | Disposition: A | Discharge status: Home / Self Care | Payer: Medicare Other | Source: Ambulatory Visit | Attending: Vascular Surgery | Admitting: Vascular Surgery

## 2017-05-22 ENCOUNTER — Encounter: Payer: Self-pay | Admitting: Vascular Surgery

## 2017-05-22 VITALS — BP 146/81 | HR 78 | Temp 98.0°F | Resp 20 | Ht 69.5 in | Wt 128.0 lb

## 2017-05-22 DIAGNOSIS — I6522 Occlusion and stenosis of left carotid artery: Secondary | ICD-10-CM | POA: Diagnosis not present

## 2017-05-22 DIAGNOSIS — Z9889 Other specified postprocedural states: Secondary | ICD-10-CM | POA: Diagnosis not present

## 2017-05-22 DIAGNOSIS — Z87891 Personal history of nicotine dependence: Secondary | ICD-10-CM | POA: Insufficient documentation

## 2017-05-22 LAB — VAS US CAROTID
LEFT ECA DIAS: -4 cm/s
LEFT VERTEBRAL DIAS: -14 cm/s
LICAPDIAS: -11 cm/s
LICAPSYS: -36 cm/s
Left CCA dist dias: -12 cm/s
Left CCA dist sys: -68 cm/s
Left CCA prox dias: 14 cm/s
Left CCA prox sys: 60 cm/s
Left ICA dist dias: -12 cm/s
Left ICA dist sys: -43 cm/s
RIGHT CCA MID DIAS: -14 cm/s
RIGHT ECA DIAS: -5 cm/s
RIGHT VERTEBRAL DIAS: 0 cm/s
Right CCA prox dias: -12 cm/s
Right CCA prox sys: -69 cm/s
Right cca dist sys: -50 cm/s

## 2017-05-22 NOTE — Progress Notes (Signed)
Patient name: Miguel Webb MRN: [id number] DOB: 1941/03/19 Sex: male  REASON FOR VISIT:   Follow-up of carotid disease.  HPI:   Miguel Webb is a pleasant 77 y.o. male who underwent a left carotid endarterectomy on 10/20/2015 an asymptomatic greater than 80% left carotid stenosis.  Comes in for a yearly follow-up visit.  Since I saw him last, he denies any history of stroke, TIAs, expressive or receptive aphasia, or amaurosis fugax.  He is on aspirin and is on a statin.  There have been no significant changes to his medical history.  He is not a smoker.  Past Medical History:  Diagnosis Date  . Arthritis   . CKD (chronic kidney disease)   . Hyperlipidemia   . Hypertension   . Hypoglycemia   . Peripheral vascular disease ([place])   . Wears glasses     Family History  Problem Relation Age of Onset  . Stroke Father   . Breast cancer Sister   . Alzheimer's disease Brother     SOCIAL HISTORY: Social History   Tobacco Use  . Smoking status: Former Smoker    Types: Cigarettes, Pipe  . Smokeless tobacco: Former Systems developer    Types: Chew  . Tobacco comment: Quit smoking cigarettes and Pipe 28-30 years ago  Substance Use Topics  . Alcohol use: Yes    Comment: 1-2 times per week drinks a little vodka    No Known Allergies  Current Outpatient Medications  Medication Sig Dispense Refill  . amLODipine (NORVASC) 10 MG tablet Take 10 mg by mouth daily.    Marland Kitchen aspirin 81 MG tablet Take 81 mg by mouth daily.    Marland Kitchen lisinopril (PRINIVIL,ZESTRIL) 20 MG tablet Take 20 mg by mouth daily.    . simvastatin (ZOCOR) 20 MG tablet Take 20 mg by mouth daily.    . polyethylene glycol-electrolytes (TRILYTE) 420 g solution Take 4,000 mLs by mouth as directed. (Patient not taking: Reported on 05/22/2017) 4000 mL 0   No current facility-administered medications for this visit.     REVIEW OF SYSTEMS:  [X]  denotes positive finding, [ ]  denotes negative finding Cardiac  Comments:  Chest pain or  chest pressure:    Shortness of breath upon exertion:    Short of breath when lying flat:    Irregular heart rhythm:        Vascular    Pain in calf, thigh, or hip brought on by ambulation:    Pain in feet at night that wakes you up from your sleep:     Blood clot in your veins:    Leg swelling:         Pulmonary    Oxygen at home:    Productive cough:     Wheezing:         Neurologic    Sudden weakness in arms or legs:     Sudden numbness in arms or legs:     Sudden onset of difficulty speaking or slurred speech:    Temporary loss of vision in one eye:     Problems with dizziness:         Gastrointestinal    Blood in stool:     Vomited blood:         Genitourinary    Burning when urinating:     Blood in urine:        Psychiatric    Major depression:         Hematologic  Bleeding problems:    Problems with blood clotting too easily:        Skin    Rashes or ulcers:        Constitutional    Fever or chills:     PHYSICAL EXAM:   Vitals:   05/22/17 1353 05/22/17 1356  BP: (!) 147/89 (!) 146/81  Pulse: 78   Resp: 20   Temp: 98 F (36.7 C)   TempSrc: Oral   SpO2: 99%   Weight: 128 lb (58.1 kg)   Height: 5' 9.5" (1.765 m)     GENERAL: The patient is a well-nourished male, in no acute distress. The vital signs are documented above. CARDIAC: There is a regular rate and rhythm.  VASCULAR: I do not detect carotid bruits. He has no lower extremity swelling. PULMONARY: There is good air exchange bilaterally without wheezing or rales. ABDOMEN: Soft and non-tender with normal pitched bowel sounds.  MUSCULOSKELETAL: There are no major deformities or cyanosis. NEUROLOGIC: No focal weakness or paresthesias are detected. SKIN: There are no ulcers or rashes noted. PSYCHIATRIC: The patient has a normal affect.  DATA:    CAROTID DUPLEX: I have independently interpreted his carotid duplex scan.  The left carotid endarterectomy site is widely patent.  He has no  significant stenosis on the right.  The left vertebral artery is patent with antegrade flow.  The right vertebral artery cannot be identified.  MEDICAL ISSUES:   STATUS POST LEFT CAROTID ENDARTERECTOMY: His left carotid endarterectomy site is widely patent.  He has no significant stenosis on the right.  He is asymptomatic.  He is on aspirin and is on a statin.  He is not a smoker.  I have ordered a follow-up carotid duplex scan in 1 year and I will see him at that time.  He knows to call sooner if he has problems.  Deitra Mayo [facility] [phone]

## 2017-09-03 DIAGNOSIS — Z79899 Other long term (current) drug therapy: Secondary | ICD-10-CM | POA: Diagnosis not present

## 2017-09-03 DIAGNOSIS — M109 Gout, unspecified: Secondary | ICD-10-CM | POA: Diagnosis not present

## 2017-09-03 DIAGNOSIS — I10 Essential (primary) hypertension: Secondary | ICD-10-CM | POA: Diagnosis not present

## 2017-09-03 DIAGNOSIS — N183 Chronic kidney disease, stage 3 (moderate): Secondary | ICD-10-CM | POA: Diagnosis not present

## 2017-09-03 DIAGNOSIS — E1129 Type 2 diabetes mellitus with other diabetic kidney complication: Secondary | ICD-10-CM | POA: Diagnosis not present

## 2017-09-10 DIAGNOSIS — E1122 Type 2 diabetes mellitus with diabetic chronic kidney disease: Secondary | ICD-10-CM | POA: Diagnosis not present

## 2017-09-10 DIAGNOSIS — E785 Hyperlipidemia, unspecified: Secondary | ICD-10-CM | POA: Diagnosis not present

## 2017-09-10 DIAGNOSIS — I10 Essential (primary) hypertension: Secondary | ICD-10-CM | POA: Diagnosis not present

## 2017-11-18 ENCOUNTER — Other Ambulatory Visit: Payer: Self-pay

## 2017-11-18 NOTE — Patient Outreach (Signed)
[facility]) Care Management  11/18/2017  Miguel Webb September 17, 1940 [id number]   Medication Adherence call to Miguel Webb patient did not answer patient is due on Simvastatin 20 mg and Lisinopril 20 mg. Miguel Webb is showing past due under [place].  [place] Management Direct Dial [phone]  Fax [phone] [email or web address]

## 2017-12-23 DIAGNOSIS — Z23 Encounter for immunization: Secondary | ICD-10-CM | POA: Diagnosis not present

## 2017-12-23 DIAGNOSIS — I10 Essential (primary) hypertension: Secondary | ICD-10-CM | POA: Diagnosis not present

## 2017-12-23 DIAGNOSIS — R42 Dizziness and giddiness: Secondary | ICD-10-CM | POA: Diagnosis not present

## 2018-01-08 ENCOUNTER — Ambulatory Visit (HOSPITAL_COMMUNITY)
Admission: RE | Admit: 2018-01-08 | Discharge: 2018-01-08 | Disposition: A | Discharge status: Home / Self Care | Payer: Medicare Other | Source: Ambulatory Visit | Attending: Internal Medicine | Admitting: Internal Medicine

## 2018-01-08 DIAGNOSIS — R55 Syncope and collapse: Secondary | ICD-10-CM | POA: Insufficient documentation

## 2018-01-08 DIAGNOSIS — I493 Ventricular premature depolarization: Secondary | ICD-10-CM | POA: Diagnosis not present

## 2018-01-08 DIAGNOSIS — R42 Dizziness and giddiness: Secondary | ICD-10-CM | POA: Diagnosis not present

## 2018-01-14 ENCOUNTER — Ambulatory Visit: Payer: Medicare Other | Admitting: Cardiology

## 2018-01-14 ENCOUNTER — Telehealth: Payer: Self-pay | Admitting: Cardiology

## 2018-01-14 ENCOUNTER — Encounter: Payer: Self-pay | Admitting: Cardiology

## 2018-01-14 ENCOUNTER — Encounter: Payer: Self-pay | Admitting: *Deleted

## 2018-01-14 VITALS — BP 122/60 | HR 55 | Ht 69.5 in | Wt 138.0 lb

## 2018-01-14 DIAGNOSIS — N183 Chronic kidney disease, stage 3 unspecified: Secondary | ICD-10-CM

## 2018-01-14 DIAGNOSIS — I6523 Occlusion and stenosis of bilateral carotid arteries: Secondary | ICD-10-CM

## 2018-01-14 DIAGNOSIS — R55 Syncope and collapse: Secondary | ICD-10-CM | POA: Diagnosis not present

## 2018-01-14 DIAGNOSIS — I10 Essential (primary) hypertension: Secondary | ICD-10-CM

## 2018-01-14 DIAGNOSIS — R9431 Abnormal electrocardiogram [ECG] [EKG]: Secondary | ICD-10-CM | POA: Diagnosis not present

## 2018-01-14 DIAGNOSIS — I493 Ventricular premature depolarization: Secondary | ICD-10-CM | POA: Diagnosis not present

## 2018-01-14 DIAGNOSIS — E1129 Type 2 diabetes mellitus with other diabetic kidney complication: Secondary | ICD-10-CM | POA: Diagnosis not present

## 2018-01-14 DIAGNOSIS — Z79899 Other long term (current) drug therapy: Secondary | ICD-10-CM | POA: Diagnosis not present

## 2018-01-14 DIAGNOSIS — E785 Hyperlipidemia, unspecified: Secondary | ICD-10-CM | POA: Diagnosis not present

## 2018-01-14 NOTE — Patient Instructions (Signed)
Medication Instructions:  Continue all current medications.  Labwork: none  Testing/Procedures:  Your physician has requested that you have an echocardiogram. Echocardiography is a painless test that uses sound waves to create images of your heart. It provides your doctor with information about the size and shape of your heart and how well your heart's chambers and valves are working. This procedure takes approximately one hour. There are no restrictions for this procedure.  Your physician has requested that you have a lexiscan myoview. For further information please visit [email or web address]. Please follow instruction sheet, as given.  Office will contact with results via phone or letter.    Follow-Up: Pending   Any Other Special Instructions Will Be Listed Below (If Applicable).  If you need a refill on your cardiac medications before your next appointment, please call your pharmacy.

## 2018-01-14 NOTE — Telephone Encounter (Signed)
Pre-cert Verification for the following procedure   Lexiscan and Echo scheduled for 01-17-2018 at [facility]

## 2018-01-14 NOTE — Progress Notes (Signed)
Cardiology Office Note  Date: 01/14/2018   ID: Miguel Webb, DOB 13-Dec-1940, MRN [id number]  PCP: Asencion Noble, MD  Consulting Cardiologist: Rozann Lesches, MD   Chief Complaint  Patient presents with  . Near Syncope    History of Present Illness: Miguel Webb is a 77 y.o. male referred for cardiology consultation by Dr. Willey Webb with history of presyncope and recently documented frequent PVCs.  He is here today with his wife, sister, and daughter.  We discussed his symptoms.  He has had at least 2-3 episodes of brief lightheadedness, not predictable necessarily or clearly orthostatic in description.  One episode occurred when he was leaning over to pull a hose out to do some watering outdoors, stood up and felt lightheaded.  One episode occurred when he was already standing and walking to the refrigerator.  He does not recall any chest tightness or palpitations with these events, has never had frank syncope.  I personally reviewed his recent cardiac monitor which showed sinus rhythm with frequent PVCs representing 7% of total beats, also bigeminy and trigeminy as well as NSVT ranging from 3 beats up to 26 beats.  No pauses.  I discussed these results with him today.  He has no previously documented history of ischemic heart disease, however multiple cardiac risk factors including age and gender, hypertension, hyperlipidemia, type 2 diabetes mellitus, and carotid artery disease.  He reports having recent lab work for Dr. Willey Webb, we are requesting the results.  CKD stage 3 listed by history but I do not have any recent baseline.  I personally reviewed his recent ECG from Dr. Willey Webb showing sinus rhythm with right bundle branch block and left anterior fascicular block, also frequent PVCs in a pattern of trigeminy.  Past Medical History:  Diagnosis Date  . Arthritis   . Carotid artery disease ([place])   . CKD (chronic kidney disease) stage 3, GFR 30-59 ml/min (HCC)   . Erectile  dysfunction   . Essential hypertension   . Hyperlipidemia   . Proteinuria   . Type 2 diabetes mellitus ([place])     Past Surgical History:  Procedure Laterality Date  . CIRCUMCISION    . COLONOSCOPY N/A 05/11/2015   Procedure: COLONOSCOPY;  Surgeon: [name], MD;  [facility];  Service: Endoscopy;  Laterality: N/A;  230   . ENDARTERECTOMY Left 10/20/2015   Procedure: Left CAROTID Endartectomy;  Surgeon: [name], MD;  Location: [place];  Service: Vascular;  Laterality: Left;  . PATCH ANGIOPLASTY Left 10/20/2015   Procedure: PATCH ANGIOPLASTY;  Surgeon: [name], MD;  Location: [place];  Service: Vascular;  Laterality: Left;  . WISDOM TOOTH EXTRACTION      Current Outpatient Medications  Medication Sig Dispense Refill  . aspirin 81 MG tablet Take 81 mg by mouth daily.    Marland Kitchen lisinopril (PRINIVIL,ZESTRIL) 20 MG tablet Take 20 mg by mouth daily.    . polyethylene glycol-electrolytes (TRILYTE) 420 g solution Take 4,000 mLs by mouth as directed. 4000 mL 0  . simvastatin (ZOCOR) 20 MG tablet Take 20 mg by mouth daily.     No current facility-administered medications for this visit.    Allergies:  Patient has no known allergies.   Social History: The patient  reports that he has quit smoking. His smoking use included cigarettes and pipe. He has quit using smokeless tobacco.  His smokeless tobacco use included chew. He reports that he drinks alcohol. He reports that he does  not use drugs.   Family History: The patient's family history includes Breast cancer in his sister; Hypertension in his brother, father, and sister; Peptic Ulcer Disease in his mother; Prostate cancer in his brother; Stroke in his father.   ROS:  Please see the history of present illness. Otherwise, complete review of systems is positive for none.  All other systems are reviewed and negative.   Physical Exam: VS:  BP 122/60   Pulse (!) 55   Ht 5' 9.5" (1.765 m)   Wt 138 lb (62.6 kg)    BMI 20.09 kg/m , BMI Body mass index is 20.09 kg/m.  Wt Readings from Last 3 Encounters:  01/14/18 138 lb (62.6 kg)  05/22/17 128 lb (58.1 kg)  05/16/16 127 lb (57.6 kg)    General: Elderly male, appears comfortable at rest. HEENT: Conjunctiva and lids normal, oropharynx clear. Neck: Supple, no elevated JVP or carotid bruits, no thyromegaly. Lungs: Clear to auscultation, nonlabored breathing at rest. Cardiac: Regular rate and rhythm, no S3, soft systolic murmur, no pericardial rub. Abdomen: Soft, nontender, bowel sounds present, no guarding or rebound. Extremities: No pitting edema, distal pulses 2+. Skin: Warm and dry. Musculoskeletal: No kyphosis. Neuropsychiatric: Alert and oriented x3, affect grossly appropriate.  ECG: I personally reviewed the tracing from 10/18/2015 which showed sinus rhythm with right bundle branch block and leftward axis.  Recent Labwork:  No lab work available for review at this time.  Other Studies Reviewed Today:  48-hour Holter monitor 01/08/2018: 48-hour Holter monitor ordered by Dr. Willey Webb.  Sinus rhythm was present throughout with frequent PVCs representing approximately 7% of total beats.  There were periods of bigeminy and trigeminy as well as NSVT ranging from 3 beats up to a peak of 28 beats.  Heart rate ranged from 61 bpm up to 151 bpm with average heart rate 85 bpm.  There were no pauses.  No symptoms specifically described in diary.  Report called to Dr. Willey Webb at 3:50 PM on October 11.  Carotid Dopplers 05/22/2017: Final Interpretation: Right Carotid: Velocities in the right ICA are consistent with a 1-39% stenosis.  Left Carotid: Velocities in the left ICA are consistent with a 1-39% stenosis.       Status post endarterectomy.  Vertebrals: Left vertebral artery was patent with antegrade flow. No evidence       of flow detected in the right vertebral artery. Subclavians: Normal flow hemodynamics were seen in bilateral  subclavian       arteries.  Assessment and Plan:  1.  Episodes of brief presyncope without frank syncope, no associated chest pain or palpitations.  Subsequent work-up per Dr. Willey Webb included a Holter monitor which demonstrates frequent PVCs representing 7% of total beats as well as episodes of NSVT as described.  I have recommended that he hold off on significant physical activity at this time, also should not drive pending further work-up.  He will be scheduled for an echocardiogram to assess cardiac structure and function and also a Lexiscan Myoview for ischemic assessment.  I would like to review his lab work particularly as it relates to degree of renal insufficiency prior to considering any other invasive cardiac assessment.  No changes were made to his medications today.  2.  Essential hypertension by history.  He was taken off Norvasc recently by Dr. Willey Webb.  Blood pressure is normal today.  Continue lisinopril.  3.  CKD stage 3 by history, plan to review recent lab work.  4.  Carotid artery disease  status post left carotid endarterectomy.  Recent carotid Doppler showed 1 to 39% bilateral ICA stenoses.  He follows with [facility].  Continue aspirin and statin.  Current medicines were reviewed with the patient today.   Orders Placed This Encounter  Procedures  . NM Myocar Multi W/Spect W/Wall Motion / EF  . ECHOCARDIOGRAM COMPLETE    Disposition: Review test results and determine next step.  Signed, Satira Sark, MD, [facility] 01/14/2018 11:03 AM    [facility] [place], [place], [place] [address] Phone: [phone]; Fax: [phone]

## 2018-01-15 NOTE — Progress Notes (Signed)
Lab work received from Dr. Willey Blade from June 2019: BUN 22, creatinine 1.54, potassium 4.4, AST 28, ALT 16, hemoglobin 13.8, platelets 176, cholesterol 172, triglycerides 43, HDL 67, LDL 76, hemoglobin A1c 5.7

## 2018-01-17 ENCOUNTER — Encounter (HOSPITAL_COMMUNITY): Payer: Self-pay

## 2018-01-17 ENCOUNTER — Ambulatory Visit (HOSPITAL_COMMUNITY)
Admission: RE | Admit: 2018-01-17 | Discharge: 2018-01-17 | Disposition: A | Discharge status: Home / Self Care | Payer: Medicare Other | Source: Ambulatory Visit | Attending: Cardiology | Admitting: Cardiology

## 2018-01-17 ENCOUNTER — Encounter (HOSPITAL_COMMUNITY)
Admission: RE | Admit: 2018-01-17 | Discharge: 2018-01-17 | Disposition: A | Discharge status: Home / Self Care | Payer: Medicare Other | Source: Ambulatory Visit | Attending: Cardiology | Admitting: Cardiology

## 2018-01-17 ENCOUNTER — Encounter (HOSPITAL_COMMUNITY): Payer: Medicare Other

## 2018-01-17 DIAGNOSIS — I10 Essential (primary) hypertension: Secondary | ICD-10-CM | POA: Insufficient documentation

## 2018-01-17 DIAGNOSIS — E785 Hyperlipidemia, unspecified: Secondary | ICD-10-CM | POA: Insufficient documentation

## 2018-01-17 DIAGNOSIS — I350 Nonrheumatic aortic (valve) stenosis: Secondary | ICD-10-CM | POA: Insufficient documentation

## 2018-01-17 DIAGNOSIS — R9431 Abnormal electrocardiogram [ECG] [EKG]: Secondary | ICD-10-CM | POA: Diagnosis not present

## 2018-01-17 DIAGNOSIS — I493 Ventricular premature depolarization: Secondary | ICD-10-CM | POA: Insufficient documentation

## 2018-01-17 DIAGNOSIS — R55 Syncope and collapse: Secondary | ICD-10-CM

## 2018-01-17 DIAGNOSIS — E119 Type 2 diabetes mellitus without complications: Secondary | ICD-10-CM | POA: Diagnosis not present

## 2018-01-17 DIAGNOSIS — I6529 Occlusion and stenosis of unspecified carotid artery: Secondary | ICD-10-CM | POA: Insufficient documentation

## 2018-01-17 LAB — NM MYOCAR MULTI W/SPECT W/WALL MOTION / EF
LVDIAVOL: 49 mL (ref 62–150)
LVSYSVOL: 20 mL
Peak HR: 105 {beats}/min
RATE: 0.36
Rest HR: 65 {beats}/min
SDS: 3
SRS: 1
SSS: 4
TID: 0.93

## 2018-01-17 MED ORDER — TECHNETIUM TC 99M TETROFOSMIN IV KIT
30.0000 | PACK | Freq: Once | INTRAVENOUS | Status: AC | PRN
  Administered 2018-01-17: 32 via INTRAVENOUS

## 2018-01-17 MED ORDER — TECHNETIUM TC 99M TETROFOSMIN IV KIT
10.0000 | PACK | Freq: Once | INTRAVENOUS | Status: AC | PRN
  Administered 2018-01-17: 10.6 via INTRAVENOUS

## 2018-01-17 MED ORDER — SODIUM CHLORIDE 0.9% FLUSH
INTRAVENOUS | Status: AC
  Administered 2018-01-17: 10 mL via INTRAVENOUS
  Filled 2018-01-17: qty 10

## 2018-01-17 MED ORDER — REGADENOSON 0.4 MG/5ML IV SOLN
INTRAVENOUS | Status: AC
  Administered 2018-01-17: 0.4 mg via INTRAVENOUS
  Filled 2018-01-17: qty 5

## 2018-01-17 NOTE — Progress Notes (Signed)
*  PRELIMINARY RESULTS* Echocardiogram 2D Echocardiogram has been performed.  Miguel Webb 01/17/2018, 10:04 AM

## 2018-01-20 ENCOUNTER — Telehealth: Payer: Self-pay | Admitting: *Deleted

## 2018-01-20 DIAGNOSIS — I472 Ventricular tachycardia: Secondary | ICD-10-CM

## 2018-01-20 DIAGNOSIS — I4729 Other ventricular tachycardia: Secondary | ICD-10-CM

## 2018-01-20 DIAGNOSIS — I493 Ventricular premature depolarization: Secondary | ICD-10-CM

## 2018-01-20 MED ORDER — METOPROLOL SUCCINATE ER 25 MG PO TB24: 12.5000 mg | ORAL_TABLET | Freq: Every day | ORAL | 1 refills | Status: DC

## 2018-01-20 NOTE — Telephone Encounter (Signed)
Patient informed and copy sent to PCP. 

## 2018-01-20 NOTE — Telephone Encounter (Signed)
-----   Message from Satira Sark, MD sent at 01/19/2018  2:21 PM EDT ----- Results reviewed. Myoview was low risk without any significant ischemic defects or evidence of infarct scar, also LVEF normal range. Let's attempt low dose beta blocker, Toprol XL 12.5 mg daily. I would also like him to have EP consultation for further assessment given frequency of PVCs and also NSVT. A copy of this test should be forwarded to Asencion Noble, MD.

## 2018-01-20 NOTE — Telephone Encounter (Signed)
-----   Message from Satira Sark, MD sent at 01/19/2018  2:17 PM EDT ----- Results reviewed. LVEF is normal range at 60-65%. This is generally reassuring in the setting of frequent PVCs. See Myoview result as well. A copy of this test should be forwarded to Asencion Noble, MD.

## 2018-01-21 ENCOUNTER — Telehealth: Payer: Self-pay | Admitting: Cardiovascular Disease

## 2018-01-21 DIAGNOSIS — N183 Chronic kidney disease, stage 3 (moderate): Secondary | ICD-10-CM | POA: Diagnosis not present

## 2018-01-21 DIAGNOSIS — I493 Ventricular premature depolarization: Secondary | ICD-10-CM | POA: Diagnosis not present

## 2018-01-21 DIAGNOSIS — E1122 Type 2 diabetes mellitus with diabetic chronic kidney disease: Secondary | ICD-10-CM | POA: Diagnosis not present

## 2018-01-21 NOTE — Telephone Encounter (Signed)
Daughter informed of results that were called to patient on yesterday.

## 2018-01-21 NOTE — Telephone Encounter (Signed)
Miguel Webb -daughter called wanting to verify everything that was told to her father. [phone]. States that he forgets things.

## 2018-01-22 ENCOUNTER — Institutional Professional Consult (permissible substitution): Payer: Medicare Other | Admitting: Internal Medicine

## 2018-02-12 ENCOUNTER — Encounter: Payer: Self-pay | Admitting: Internal Medicine

## 2018-02-12 ENCOUNTER — Ambulatory Visit: Payer: Medicare Other | Admitting: Internal Medicine

## 2018-02-12 VITALS — BP 144/68 | HR 65 | Ht 69.5 in | Wt 126.8 lb

## 2018-02-12 DIAGNOSIS — I10 Essential (primary) hypertension: Secondary | ICD-10-CM

## 2018-02-12 DIAGNOSIS — R55 Syncope and collapse: Secondary | ICD-10-CM | POA: Diagnosis not present

## 2018-02-12 DIAGNOSIS — I493 Ventricular premature depolarization: Secondary | ICD-10-CM

## 2018-02-12 NOTE — Patient Instructions (Addendum)
Medication Instructions:  Your physician recommends that you continue on your current medications as directed. Please refer to the Current Medication list given to you today.  Labwork: None ordered.  Testing/Procedures: None ordered.  Follow-Up: Your physician wants you to follow-up in: 3 months with Chanetta Marshall, NP.   You will receive a reminder letter in the mail two months in advance. If you don't receive a letter, please call our office to schedule the follow-up appointment.  Any Other Special Instructions Will Be Listed Below (If Applicable).  If you need a refill on your cardiac medications before your next appointment, please call your pharmacy.

## 2018-02-12 NOTE — Progress Notes (Signed)
Electrophysiology Office Note   Date:  02/12/2018   ID:  Miguel Webb, DOB 19-Dec-1940, MRN [id number]  PCP:  Asencion Noble, MD  Cardiologist:  Dr Domenic Polite Primary Electrophysiologist: Thompson Grayer, MD    CC: PVCs   History of Present Illness: Miguel Webb is a 77 y.o. male who presents today for electrophysiology evaluation.   He is referred by Dr Domenic Polite for EP consultation regarding PVCs.   He was evaluated by Dr Domenic Polite for symptoms of lightheadedness.  He had a cardiac monitor placed which documented PVC burden of 7%.  He also had NSVT from 3-26 beats. He reports occasional dizziness after bending or upon standing.  Some episodes occur after prolonged standing.  Denies episodes while supine. He denies palpitations with his PVCs.  Denies syncope.  Today, he denies symptoms of chest pain, shortness of breath, orthopnea, PND, lower extremity edema, claudication, presyncope, syncope, bleeding, or neurologic sequela. The patient is tolerating medications without difficulties and is otherwise without complaint today.   Past Medical History:  Diagnosis Date  . Arthritis   . Carotid artery disease ([place])   . CKD (chronic kidney disease) stage 3, GFR 30-59 ml/min (HCC)   . Erectile dysfunction   . Essential hypertension   . Hyperlipidemia   . Proteinuria   . Type 2 diabetes mellitus ([place])    Past Surgical History:  Procedure Laterality Date  . CIRCUMCISION    . COLONOSCOPY N/A 05/11/2015   Procedure: COLONOSCOPY;  Surgeon: [name], MD;  [facility];  Service: Endoscopy;  Laterality: N/A;  230   . ENDARTERECTOMY Left 10/20/2015   Procedure: Left CAROTID Endartectomy;  Surgeon: [name], MD;  Location: [place];  Service: Vascular;  Laterality: Left;  . PATCH ANGIOPLASTY Left 10/20/2015   Procedure: PATCH ANGIOPLASTY;  Surgeon: [name], MD;  Location: [place];  Service: Vascular;  Laterality: Left;  . WISDOM TOOTH EXTRACTION        Current Outpatient Medications  Medication Sig Dispense Refill  . aspirin 81 MG tablet Take 81 mg by mouth daily.    Marland Kitchen lisinopril (PRINIVIL,ZESTRIL) 20 MG tablet Take 20 mg by mouth daily.    . metoprolol succinate (TOPROL XL) 25 MG 24 hr tablet Take 0.5 tablets (12.5 mg total) by mouth daily. 45 tablet 1  . polyethylene glycol-electrolytes (TRILYTE) 420 g solution Take 4,000 mLs by mouth as directed. 4000 mL 0  . simvastatin (ZOCOR) 20 MG tablet Take 20 mg by mouth daily.     No current facility-administered medications for this visit.     Allergies:   Patient has no known allergies.   Social History:  The patient  reports that he has quit smoking. His smoking use included cigarettes and pipe. He has quit using smokeless tobacco.  His smokeless tobacco use included chew. He reports that he drinks alcohol. He reports that he does not use drugs.   Family History:  The patient's  family history includes Breast cancer in his sister; Hypertension in his brother, father, and sister; Peptic Ulcer Disease in his mother; Prostate cancer in his brother; Stroke in his father.    ROS:  Please see the history of present illness.   All other systems are personally reviewed and negative.    PHYSICAL EXAM: VS:  BP (!) 144/68   Pulse 65   Ht 5' 9.5" (1.765 m)   Wt 126 lb 12.8 oz (57.5 kg)   SpO2 97%   BMI 18.46  kg/m  , BMI Body mass index is 18.46 kg/m. GEN: Well nourished, well developed, in no acute distress  HEENT: normal  Neck: no JVD, carotid bruits, or masses Cardiac: RRR; no murmurs, rubs, or gallops,no edema  Respiratory:  clear to auscultation bilaterally, normal work of breathing GI: soft, nontender, nondistended, + BS MS: no deformity or atrophy  Skin: warm and dry  Neuro:  Strength and sensation are intact Psych: euthymic mood, full affect  EKG:  EKG is ordered today. The ekg ordered today is personally reviewed and shows sinus rhythm 65 bpm, PR 198 msec, QRS 120 msec,  QTc 438 msec    Wt Readings from Last 3 Encounters:  02/12/18 126 lb 12.8 oz (57.5 kg)  01/14/18 138 lb (62.6 kg)  05/22/17 128 lb (58.1 kg)    Other studies personally reviewed: Additional studies/ records that were reviewed today include: Dr Chuck Hint notes, recent monitor, echo  Review of the above records today demonstrates: echo 01/17/18 reveals EF 60%, mild LVH, normal RV size and function  Prior ekgs reveals LBB inferior axis PVCs with transition at V3.   ASSESSMENT AND PLAN:  1.  PVCs, NSVT Outflow tract in origin Benign No pvcs today started beta blocker by Dr Domenic Polite which he is tolerating well  2. HTN Most of his dizziness is postural and has resolved off of amlodipine I would avoid diuretics long term Adequate hydration encouraged  Follow-up:  Return to see EP NP in 3 months.  If doing well clinically at that time, EP will see as needed thereafter.  Current medicines are reviewed at length with the patient today.   The patient does not have concerns regarding his medicines.  The following changes were made today:  none    Signed, Thompson Grayer, MD  02/12/2018 9:35 AM     [facility] [address] [place] [address] [phone] (office) [phone] (fax)

## 2018-05-04 ENCOUNTER — Emergency Department (HOSPITAL_COMMUNITY)
Admission: EM | Admit: 2018-05-04 | Discharge: 2018-05-04 | Disposition: A | Discharge status: Home / Self Care | Payer: Medicare Other | Attending: Emergency Medicine | Admitting: Emergency Medicine

## 2018-05-04 ENCOUNTER — Encounter (HOSPITAL_COMMUNITY): Payer: Self-pay | Admitting: Emergency Medicine

## 2018-05-04 ENCOUNTER — Other Ambulatory Visit: Payer: Self-pay

## 2018-05-04 DIAGNOSIS — R040 Epistaxis: Secondary | ICD-10-CM | POA: Diagnosis not present

## 2018-05-04 DIAGNOSIS — Z7982 Long term (current) use of aspirin: Secondary | ICD-10-CM | POA: Insufficient documentation

## 2018-05-04 DIAGNOSIS — E1122 Type 2 diabetes mellitus with diabetic chronic kidney disease: Secondary | ICD-10-CM | POA: Insufficient documentation

## 2018-05-04 DIAGNOSIS — I10 Essential (primary) hypertension: Secondary | ICD-10-CM | POA: Diagnosis not present

## 2018-05-04 DIAGNOSIS — Z79899 Other long term (current) drug therapy: Secondary | ICD-10-CM | POA: Insufficient documentation

## 2018-05-04 DIAGNOSIS — I129 Hypertensive chronic kidney disease with stage 1 through stage 4 chronic kidney disease, or unspecified chronic kidney disease: Secondary | ICD-10-CM | POA: Diagnosis not present

## 2018-05-04 DIAGNOSIS — Z87891 Personal history of nicotine dependence: Secondary | ICD-10-CM | POA: Diagnosis not present

## 2018-05-04 DIAGNOSIS — N183 Chronic kidney disease, stage 3 (moderate): Secondary | ICD-10-CM | POA: Insufficient documentation

## 2018-05-04 NOTE — ED Triage Notes (Signed)
Pt c/o nose bleed this morning x about 10 mins, bleeding controlled at this time

## 2018-05-04 NOTE — ED Provider Notes (Signed)
[facility] EMERGENCY DEPARTMENT Provider Note   CSN: [id number] Arrival date & time: 05/04/18  0401     History   Chief Complaint Chief Complaint  Patient presents with  . Epistaxis    HPI Miguel Webb is a 78 y.o. male.  HPI  78 year old male with history of coronary artery disease, CKD comes in with chief complaint of bloody nose.  Patient states that he started having bleeding from his right nare while he was asleep.  The bleeding stopped on its own prior to my evaluation.  Patient denies any trauma.  Patient takes aspirin and has CKD.  He denies any history of nosebleeds -besides one isolated episode 3 days ago that was minor.  Past Medical History:  Diagnosis Date  . Arthritis   . Carotid artery disease ([place])   . CKD (chronic kidney disease) stage 3, GFR 30-59 ml/min (HCC)   . Erectile dysfunction   . Essential hypertension   . Hyperlipidemia   . Proteinuria   . Type 2 diabetes mellitus [facility])     Patient Active Problem List   Diagnosis Date Noted  . Carotid stenosis, asymptomatic 10/20/2015    Past Surgical History:  Procedure Laterality Date  . CIRCUMCISION    . COLONOSCOPY N/A 05/11/2015   Procedure: COLONOSCOPY;  Surgeon: [name], MD;  [facility];  Service: Endoscopy;  Laterality: N/A;  230   . ENDARTERECTOMY Left 10/20/2015   Procedure: Left CAROTID Endartectomy;  Surgeon: [name], MD;  Location: [place];  Service: Vascular;  Laterality: Left;  . PATCH ANGIOPLASTY Left 10/20/2015   Procedure: PATCH ANGIOPLASTY;  Surgeon: [name], MD;  Location: [place];  Service: Vascular;  Laterality: Left;  . WISDOM TOOTH EXTRACTION          Home Medications    Prior to Admission medications   Medication Sig Start Date End Date Taking? Authorizing Provider  aspirin 81 MG tablet Take 81 mg by mouth daily.    [provider]  lisinopril (PRINIVIL,ZESTRIL) 20 MG tablet Take 20 mg by mouth daily.    [provider]  metoprolol succinate (TOPROL XL) 25 MG 24 hr tablet Take 0.5 tablets (12.5 mg total) by mouth daily. 01/20/18   Satira Sark, MD  polyethylene glycol-electrolytes (TRILYTE) 420 g solution Take 4,000 mLs by mouth as directed. 04/26/15   Rourk, Cristopher Estimable, MD  simvastatin (ZOCOR) 20 MG tablet Take 20 mg by mouth daily.    [provider]    Family History Family History  Problem Relation Age of Onset  . Peptic Ulcer Disease Mother   . Stroke Father   . Hypertension Father   . Breast cancer Sister   . Hypertension Sister   . Prostate cancer Brother   . Hypertension Brother     Social History Social History   Tobacco Use  . Smoking status: Former Smoker    Types: Cigarettes, Pipe  . Smokeless tobacco: Former Systems developer    Types: Chew  . Tobacco comment: Quit smoking cigarettes and Pipe 28-30 years ago  Substance Use Topics  . Alcohol use: Yes    Comment: 1-2 times per week drinks a little vodka  . Drug use: No     Allergies   Patient has no known allergies.   Review of Systems Review of Systems  HENT: Positive for nosebleeds.   Hematological: Does not bruise/bleed easily.     Physical Exam Updated Vital Signs BP (!) 183/106  Pulse 85   Temp 98 F (36.7 C) (Oral)   Resp 16   Ht 5\' 6"  (1.676 m)   Wt 61.2 kg   SpO2 98%   BMI 21.79 kg/m   Physical Exam Vitals signs and nursing note reviewed.  Constitutional:      Appearance: He is well-developed.  HENT:     Head: Atraumatic.     Nose: No congestion or rhinorrhea.     Comments: No active bleeding appreciated.  No dry blood or scabbing appreciated Neck:     Musculoskeletal: Neck supple.  Cardiovascular:     Rate and Rhythm: Normal rate.  Pulmonary:     Effort: Pulmonary effort is normal.  Skin:    General: Skin is warm.  Neurological:     Mental Status: He is alert and oriented to person, place, and time.      ED Treatments / Results  Labs (all labs ordered are listed, but  only abnormal results are displayed) Labs Reviewed - No data to display  EKG None  Radiology No results found.  Procedures Procedures (including critical care time)  Medications Ordered in ED Medications - No data to display   Initial Impression / Assessment and Plan / ED Course  I have reviewed the triage vital signs and the nursing notes.  Pertinent labs & imaging results that were available during my care of the patient were reviewed by me and considered in my medical decision making (see chart for details).     78 year old man came to the ER with chief complaint of nosebleed.  It appears that the symptoms were likely unprovoked and possibly because of dry weather. He does not have any bleed.  He is not on any blood thinners.  He does take aspirin and has CKD, which might have contributed.  The bleeding is stopped spontaneously.  He has been watched in the ER for an hour without recurrence.  Stable for discharge.  BP is slightly elevated, advised him to monitor it closely and see his PCP if needed.  Final Clinical Impressions(s) / ED Diagnoses   Final diagnoses:  Epistaxis    ED Discharge Orders    None       Varney Biles, MD 05/04/18 [phone]

## 2018-05-04 NOTE — Discharge Instructions (Signed)

## 2018-05-05 DIAGNOSIS — I10 Essential (primary) hypertension: Secondary | ICD-10-CM | POA: Diagnosis not present

## 2018-05-05 DIAGNOSIS — E1129 Type 2 diabetes mellitus with other diabetic kidney complication: Secondary | ICD-10-CM | POA: Diagnosis not present

## 2018-05-05 DIAGNOSIS — Z79899 Other long term (current) drug therapy: Secondary | ICD-10-CM | POA: Diagnosis not present

## 2018-05-05 DIAGNOSIS — N183 Chronic kidney disease, stage 3 (moderate): Secondary | ICD-10-CM | POA: Diagnosis not present

## 2018-05-16 DIAGNOSIS — N183 Chronic kidney disease, stage 3 (moderate): Secondary | ICD-10-CM | POA: Diagnosis not present

## 2018-05-16 DIAGNOSIS — E1122 Type 2 diabetes mellitus with diabetic chronic kidney disease: Secondary | ICD-10-CM | POA: Diagnosis not present

## 2018-05-16 DIAGNOSIS — I493 Ventricular premature depolarization: Secondary | ICD-10-CM | POA: Diagnosis not present

## 2018-07-15 ENCOUNTER — Other Ambulatory Visit: Payer: Self-pay | Admitting: Cardiology

## 2018-08-14 DIAGNOSIS — I493 Ventricular premature depolarization: Secondary | ICD-10-CM | POA: Diagnosis not present

## 2018-08-14 DIAGNOSIS — E1129 Type 2 diabetes mellitus with other diabetic kidney complication: Secondary | ICD-10-CM | POA: Diagnosis not present

## 2018-08-14 DIAGNOSIS — G3184 Mild cognitive impairment, so stated: Secondary | ICD-10-CM | POA: Diagnosis not present

## 2018-10-24 ENCOUNTER — Other Ambulatory Visit: Payer: Self-pay

## 2018-10-24 NOTE — Patient Outreach (Signed)
[facility]) Care Management  10/24/2018  KARLO GOEDEN [birth year] [id number]   Medication Adherence call to Mr. [place] Mokry Hippa Identifiers Verify spoke with patient he is past due on Simvastatin 20 mg patient explain he is taking 1 tablet daily and never missed a dose patient explain he has medication at this time and will order when due. Mr. Mcnutt is showing past due under [place].   [place] Management Direct Dial [phone]  Fax [phone] [email or web address]

## 2018-11-10 ENCOUNTER — Other Ambulatory Visit: Payer: Self-pay

## 2018-11-10 DIAGNOSIS — Z20822 Contact with and (suspected) exposure to covid-19: Secondary | ICD-10-CM

## 2018-11-11 LAB — NOVEL CORONAVIRUS, NAA: SARS-CoV-2, NAA: NOT DETECTED

## 2018-11-20 ENCOUNTER — Other Ambulatory Visit: Payer: Self-pay | Admitting: Internal Medicine

## 2018-11-20 ENCOUNTER — Other Ambulatory Visit (HOSPITAL_COMMUNITY): Payer: Self-pay | Admitting: Internal Medicine

## 2018-11-20 DIAGNOSIS — I493 Ventricular premature depolarization: Secondary | ICD-10-CM | POA: Diagnosis not present

## 2018-11-20 DIAGNOSIS — R413 Other amnesia: Secondary | ICD-10-CM | POA: Diagnosis not present

## 2018-11-20 DIAGNOSIS — E1129 Type 2 diabetes mellitus with other diabetic kidney complication: Secondary | ICD-10-CM | POA: Diagnosis not present

## 2018-11-27 ENCOUNTER — Other Ambulatory Visit: Payer: Self-pay

## 2018-11-27 ENCOUNTER — Ambulatory Visit (HOSPITAL_COMMUNITY)
Admission: RE | Admit: 2018-11-27 | Discharge: 2018-11-27 | Disposition: A | Discharge status: Home / Self Care | Payer: Medicare Other | Source: Ambulatory Visit | Attending: Internal Medicine | Admitting: Internal Medicine

## 2018-11-27 DIAGNOSIS — I10 Essential (primary) hypertension: Secondary | ICD-10-CM | POA: Diagnosis not present

## 2018-11-27 DIAGNOSIS — Z79899 Other long term (current) drug therapy: Secondary | ICD-10-CM | POA: Diagnosis not present

## 2018-11-27 DIAGNOSIS — E1129 Type 2 diabetes mellitus with other diabetic kidney complication: Secondary | ICD-10-CM | POA: Diagnosis not present

## 2018-11-27 DIAGNOSIS — R413 Other amnesia: Secondary | ICD-10-CM | POA: Insufficient documentation

## 2018-11-27 DIAGNOSIS — G3184 Mild cognitive impairment, so stated: Secondary | ICD-10-CM | POA: Diagnosis not present

## 2018-11-27 DIAGNOSIS — N183 Chronic kidney disease, stage 3 (moderate): Secondary | ICD-10-CM | POA: Diagnosis not present

## 2018-12-11 DIAGNOSIS — M7662 Achilles tendinitis, left leg: Secondary | ICD-10-CM | POA: Diagnosis not present

## 2018-12-11 DIAGNOSIS — S93332D Other subluxation of left foot, subsequent encounter: Secondary | ICD-10-CM | POA: Diagnosis not present

## 2018-12-11 DIAGNOSIS — M25775 Osteophyte, left foot: Secondary | ICD-10-CM | POA: Diagnosis not present

## 2018-12-11 DIAGNOSIS — M79671 Pain in right foot: Secondary | ICD-10-CM | POA: Diagnosis not present

## 2018-12-11 DIAGNOSIS — S93331D Other subluxation of right foot, subsequent encounter: Secondary | ICD-10-CM | POA: Diagnosis not present

## 2019-01-13 DIAGNOSIS — Z23 Encounter for immunization: Secondary | ICD-10-CM | POA: Diagnosis not present

## 2019-02-24 DIAGNOSIS — E1129 Type 2 diabetes mellitus with other diabetic kidney complication: Secondary | ICD-10-CM | POA: Diagnosis not present

## 2019-02-24 DIAGNOSIS — Z79899 Other long term (current) drug therapy: Secondary | ICD-10-CM | POA: Diagnosis not present

## 2019-02-24 DIAGNOSIS — N183 Chronic kidney disease, stage 3 unspecified: Secondary | ICD-10-CM | POA: Diagnosis not present

## 2019-02-24 DIAGNOSIS — R413 Other amnesia: Secondary | ICD-10-CM | POA: Diagnosis not present

## 2019-03-02 DIAGNOSIS — N1831 Chronic kidney disease, stage 3a: Secondary | ICD-10-CM | POA: Diagnosis not present

## 2019-03-02 DIAGNOSIS — R413 Other amnesia: Secondary | ICD-10-CM | POA: Diagnosis not present

## 2019-03-02 DIAGNOSIS — E1122 Type 2 diabetes mellitus with diabetic chronic kidney disease: Secondary | ICD-10-CM | POA: Diagnosis not present

## 2019-03-10 ENCOUNTER — Other Ambulatory Visit: Payer: Self-pay | Admitting: *Deleted

## 2019-03-10 DIAGNOSIS — Z20822 Contact with and (suspected) exposure to covid-19: Secondary | ICD-10-CM

## 2019-03-12 ENCOUNTER — Telehealth: Payer: Self-pay | Admitting: *Deleted

## 2019-03-12 ENCOUNTER — Other Ambulatory Visit: Payer: Self-pay

## 2019-03-12 DIAGNOSIS — I739 Peripheral vascular disease, unspecified: Secondary | ICD-10-CM | POA: Diagnosis not present

## 2019-03-12 DIAGNOSIS — M79672 Pain in left foot: Secondary | ICD-10-CM | POA: Diagnosis not present

## 2019-03-12 DIAGNOSIS — Z20822 Contact with and (suspected) exposure to covid-19: Secondary | ICD-10-CM

## 2019-03-12 DIAGNOSIS — M79671 Pain in right foot: Secondary | ICD-10-CM | POA: Diagnosis not present

## 2019-03-12 DIAGNOSIS — L110 Acquired keratosis follicularis: Secondary | ICD-10-CM | POA: Diagnosis not present

## 2019-03-12 NOTE — Addendum Note (Signed)
Addended by: Danielle Dess on: 03/12/2019 11:15 AM   Modules accepted: Orders

## 2019-03-12 NOTE — Telephone Encounter (Signed)
Pt's wife called to check status of his covid test result. She is advised that his result is not back. Call back later. She voiced understanding.

## 2019-03-13 ENCOUNTER — Telehealth: Payer: Self-pay | Admitting: *Deleted

## 2019-03-13 LAB — NOVEL CORONAVIRUS, NAA: SARS-CoV-2, NAA: NOT DETECTED

## 2019-03-13 NOTE — Telephone Encounter (Signed)
Patient given negative covid results . 

## 2019-04-09 DIAGNOSIS — M10062 Idiopathic gout, left knee: Secondary | ICD-10-CM | POA: Diagnosis not present

## 2019-05-12 DIAGNOSIS — Z79899 Other long term (current) drug therapy: Secondary | ICD-10-CM | POA: Diagnosis not present

## 2019-05-12 DIAGNOSIS — R1032 Left lower quadrant pain: Secondary | ICD-10-CM | POA: Diagnosis not present

## 2019-05-12 DIAGNOSIS — M109 Gout, unspecified: Secondary | ICD-10-CM | POA: Diagnosis not present

## 2019-05-14 DIAGNOSIS — M109 Gout, unspecified: Secondary | ICD-10-CM | POA: Diagnosis not present

## 2019-06-04 DIAGNOSIS — E1129 Type 2 diabetes mellitus with other diabetic kidney complication: Secondary | ICD-10-CM | POA: Diagnosis not present

## 2019-06-04 DIAGNOSIS — R413 Other amnesia: Secondary | ICD-10-CM | POA: Diagnosis not present

## 2019-06-04 DIAGNOSIS — N183 Chronic kidney disease, stage 3 unspecified: Secondary | ICD-10-CM | POA: Diagnosis not present

## 2019-06-04 DIAGNOSIS — Z79899 Other long term (current) drug therapy: Secondary | ICD-10-CM | POA: Diagnosis not present

## 2019-06-09 DIAGNOSIS — M109 Gout, unspecified: Secondary | ICD-10-CM | POA: Diagnosis not present

## 2019-06-09 DIAGNOSIS — E1122 Type 2 diabetes mellitus with diabetic chronic kidney disease: Secondary | ICD-10-CM | POA: Diagnosis not present

## 2019-06-09 DIAGNOSIS — N1831 Chronic kidney disease, stage 3a: Secondary | ICD-10-CM | POA: Diagnosis not present

## 2019-07-11 DIAGNOSIS — R5381 Other malaise: Secondary | ICD-10-CM | POA: Diagnosis not present

## 2019-07-11 DIAGNOSIS — R69 Illness, unspecified: Secondary | ICD-10-CM | POA: Diagnosis not present

## 2019-07-22 ENCOUNTER — Other Ambulatory Visit: Payer: Self-pay | Admitting: Cardiology

## 2019-08-13 DIAGNOSIS — E1129 Type 2 diabetes mellitus with other diabetic kidney complication: Secondary | ICD-10-CM | POA: Diagnosis not present

## 2019-08-13 DIAGNOSIS — M109 Gout, unspecified: Secondary | ICD-10-CM | POA: Diagnosis not present

## 2019-08-13 DIAGNOSIS — N183 Chronic kidney disease, stage 3 unspecified: Secondary | ICD-10-CM | POA: Diagnosis not present

## 2019-08-13 DIAGNOSIS — Z79899 Other long term (current) drug therapy: Secondary | ICD-10-CM | POA: Diagnosis not present

## 2019-08-14 DIAGNOSIS — N1831 Chronic kidney disease, stage 3a: Secondary | ICD-10-CM | POA: Diagnosis not present

## 2019-08-14 DIAGNOSIS — M109 Gout, unspecified: Secondary | ICD-10-CM | POA: Diagnosis not present

## 2019-10-30 IMAGING — NM NM MYOCAR MULTI W/SPECT W/WALL MOTION & EF
2 series · 12 of 12 positions shown · non-contrast
Comparison: none

[Series 1: rest · 6.51mm/px · 6 of 64 frames shown]
[frame 6/64]
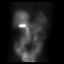
[frame 16/64]
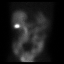
[frame 27/64]
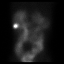
[frame 38/64]
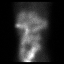
[frame 48/64]
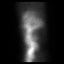
[frame 59/64]
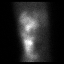

[Series 3: stress gated - perfusion · 6.51mm/px · 6 of 64 frames shown]
[frame 6/64]
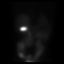
[frame 16/64]
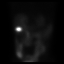
[frame 27/64]
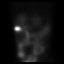
[frame 38/64]
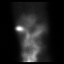
[frame 48/64]
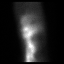
[frame 59/64]
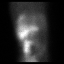

[12 of 12 positions shown; findings below may reference images not displayed]

Canned report from images found in remote index.

Refer to host system for actual result text.

## 2019-11-10 DIAGNOSIS — E119 Type 2 diabetes mellitus without complications: Secondary | ICD-10-CM | POA: Diagnosis not present

## 2019-11-10 DIAGNOSIS — M109 Gout, unspecified: Secondary | ICD-10-CM | POA: Diagnosis not present

## 2019-11-10 DIAGNOSIS — Z79899 Other long term (current) drug therapy: Secondary | ICD-10-CM | POA: Diagnosis not present

## 2019-11-10 DIAGNOSIS — N183 Chronic kidney disease, stage 3 unspecified: Secondary | ICD-10-CM | POA: Diagnosis not present

## 2019-11-17 DIAGNOSIS — M109 Gout, unspecified: Secondary | ICD-10-CM | POA: Diagnosis not present

## 2019-11-17 DIAGNOSIS — E1129 Type 2 diabetes mellitus with other diabetic kidney complication: Secondary | ICD-10-CM | POA: Diagnosis not present

## 2019-11-17 DIAGNOSIS — N1831 Chronic kidney disease, stage 3a: Secondary | ICD-10-CM | POA: Diagnosis not present

## 2019-12-01 DIAGNOSIS — Z79899 Other long term (current) drug therapy: Secondary | ICD-10-CM | POA: Diagnosis not present

## 2019-12-01 DIAGNOSIS — Z0182 Encounter for allergy testing: Secondary | ICD-10-CM | POA: Diagnosis not present

## 2019-12-01 DIAGNOSIS — M109 Gout, unspecified: Secondary | ICD-10-CM | POA: Diagnosis not present

## 2019-12-15 DIAGNOSIS — M109 Gout, unspecified: Secondary | ICD-10-CM | POA: Diagnosis not present

## 2020-01-05 DIAGNOSIS — Z23 Encounter for immunization: Secondary | ICD-10-CM | POA: Diagnosis not present

## 2020-01-14 DIAGNOSIS — M1000 Idiopathic gout, unspecified site: Secondary | ICD-10-CM | POA: Diagnosis not present

## 2020-02-08 ENCOUNTER — Encounter (HOSPITAL_COMMUNITY): Payer: Self-pay

## 2020-02-08 ENCOUNTER — Ambulatory Visit (HOSPITAL_COMMUNITY): Payer: Medicare Other | Attending: Internal Medicine

## 2020-02-08 ENCOUNTER — Other Ambulatory Visit: Payer: Self-pay

## 2020-02-08 DIAGNOSIS — M79641 Pain in right hand: Secondary | ICD-10-CM | POA: Insufficient documentation

## 2020-02-08 DIAGNOSIS — R29898 Other symptoms and signs involving the musculoskeletal system: Secondary | ICD-10-CM | POA: Insufficient documentation

## 2020-02-08 DIAGNOSIS — M25641 Stiffness of right hand, not elsewhere classified: Secondary | ICD-10-CM | POA: Diagnosis not present

## 2020-02-08 NOTE — Therapy (Signed)
Miguel Webb, [name], [address] Phone: [phone]   Fax:  [phone]  Occupational Therapy Evaluation  Patient Details  Name: Miguel Webb MRN: [id number] Date of Birth: [birth year] Referring Provider (OT): Asencion Noble, MD   Encounter Date: 02/08/2020   OT End of Session - 02/08/20 1217    Visit Number 1    Number of Visits 1    Authorization Type [employer] Medicare    OT Start Time 1115    OT Stop Time 1205    OT Time Calculation (min) 50 min    Activity Tolerance Patient tolerated treatment well    Behavior During Therapy [facility] for tasks assessed/performed           Past Medical History:  Diagnosis Date   Arthritis    Carotid artery disease ([facility])    CKD (chronic kidney disease) stage 3, GFR 30-59 ml/min (HCC)    Erectile dysfunction    Essential hypertension    Hyperlipidemia    Proteinuria    Type 2 diabetes mellitus ([place])     Past Surgical History:  Procedure Laterality Date   CIRCUMCISION     COLONOSCOPY N/A 05/11/2015   Procedure: COLONOSCOPY;  Surgeon: [name], MD;  [facility];  Service: Endoscopy;  Laterality: N/A;  230    ENDARTERECTOMY Left 10/20/2015   Procedure: Left CAROTID Endartectomy;  Surgeon: [name], MD;  Location: [place];  Service: Vascular;  Laterality: Left;   PATCH ANGIOPLASTY Left 10/20/2015   Procedure: PATCH ANGIOPLASTY;  Surgeon: [name], MD;  Location: [place];  Service: Vascular;  Laterality: Left;   WISDOM TOOTH EXTRACTION      There were no vitals filed for this visit.   Subjective Assessment - 02/08/20 1126    Subjective  S: My hand is just like dead weight cause I can't use it as much anymore    Patient is accompanied by: Family member   patient's wife   Pertinent History Patient is a 79 y/o male is referred by Dr. Willey Blade for right hand stiffness, with history of gout, for skilled OT evaluation. Patient reports that  his hand stiffness has been an ongoing issue, with increase in symptoms in January of 2021 when he was frequently picking then eating home grown tomatoes. Patient reports that the MD indicated that the increase in acidic foods increased his gout symptoms. Patients reports that he is currently using a stressball at home that works and has topical cream that helps with pain.    Patient Stated Goals Use my hand more    Currently in Pain? Yes   no pain until trying to make fist   Pain Score 8     Pain Location Hand    Pain Orientation Right    Pain Descriptors / Indicators Tightness    Pain Type Chronic pain    Pain Radiating Towards n/a    Pain Onset More than a month ago    Pain Frequency Intermittent    Aggravating Factors  Movement, overuse    Pain Relieving Factors Topical cream, using ball    Effect of Pain on Daily Activities Mod effect    Multiple Pain Sites No             [facility] OT Assessment - 02/08/20 1119      Assessment   Medical Diagnosis Right hand stiffness with history of gout    Referring Provider (OT) Asencion Noble, MD  Onset Date/Surgical Date --   January 2021   Hand Dominance Right    Next MD Visit 02/11/20    Prior Therapy none      Precautions   Precautions None      Restrictions   Weight Bearing Restrictions No      Balance Screen   Has the patient fallen in the past 6 months No      Home  Environment   Family/patient expects to be discharged to: Private residence    Lives With Spouse      Prior Function   Level of [place] Retired    Leisure Clinical research associate      ADL   ADL comments Patient reports difficulty with gripping, being able to make a fist, picking up items, opening containers, buttoning/zipping requires increaesd time      Cognition   Overall Cognitive Status Within Functional Limits for tasks assessed    Cognition Comments Patient required guidance from wife on occasion to help him answer questions, however  otherwise cognitively functional      ROM / Strength   AROM / PROM / Strength AROM;PROM      AROM   Overall AROM Comments Right hand A/ROM has slight functional deficits when compared to left, however is able to make a functional fist. Reports stiffness and pain with fist making but is able to form functional fist    AROM Assessment Site Other (comment)   Right hand     PROM   Overall PROM Comments WFL    PROM Assessment Site Other (comment)   Right hand                          OT Education - 02/08/20 1243    Education Details Hand A/ROM, yellow theraputty. Discussed arthritic changes, progression of therapy at this stage being more on compensatory strategies, with education and resources provided for ADL techniques such as built up handle, button hook, and using dycem for opening containers, along with increasing HEP compliance to improve pain and stiffness symptoms    Person(s) Educated Patient;Spouse    Methods Explanation;Handout;Verbal cues;Demonstration    Comprehension Verbalized understanding;Returned demonstration;Verbal cues required            OT Short Term Goals - 02/08/20 1218      OT SHORT TERM GOAL #1   Title Patient will be educated and independent in a HEP to improve pain in right hand for increased functional use of his hand as his dominant.    Time 1    Period Days    Status Achieved    Target Date 02/08/20                    Plan - 02/08/20 1223    Clinical Impression Statement A: Patient is an 79 y/o male with right hand stiffness and history of gout, causing painful A/ROM and functional use of the right hand. Patient presents with ability to form a functional fist, however is slightly below baseline when compared to left hand. Patient's wife reports that he has been given recommendations by MD such as using stress ball, which patient reports does help the stiffness, but wife indicates that patient does not comply with what has  been suggested. Todays session focused on education regarding A/ROM of digits and hand and light grip strengthening exercises using theraputty to assist with stiffness and hand pain. Patient educated on  importance of HEP compliance as arthritic changes are irreversible and increased movement of the hand helps alleviate stiffness of joints. Provided dycem for modification technique for gripping items and opening containers with patient demonstrating ease of access while practicing opening a jar in clinic while using dycem, as well as education on using built up handles and resources for purchasing adaptive equipment online to help with modification strategies to increase independence. At this time, patient does not need OT intervention, as patient and patient's wife are in agreement that they wanted exercises and things that could be done at home versus coming in due to a large issue of patient's hand stiffness improvement is his compliance with following recommendations that are given.    OT Occupational Profile and History Problem Focused Assessment - Including review of records relating to presenting problem    Occupational performance deficits (Please refer to evaluation for details): ADL's;IADL's;Leisure    Body Structure / Function / Physical Skills UE functional use;ROM;Pain;ADL    Rehab Potential Fair    Clinical Decision Making Limited treatment options, no task modification necessary    Comorbidities Affecting Occupational Performance: Presence of comorbidities impacting occupational performance    Comorbidities impacting occupational performance description: History of gout, arthritis, diabetes    Modification or Assistance to Complete Evaluation  Min-Moderate modification of tasks or assist with assess necessary to complete eval    OT Frequency One time visit    OT Treatment/Interventions Self-care/ADL training;Patient/family education;Passive range of motion;DME and/or AE  instruction;Therapeutic activities    Plan P: Patient does not need OT at this time, HEP provided at end of session    OT Home Exercise Plan Eval: A/ROM hand, theraputty yellow    Consulted and Agree with Plan of Care Patient           Patient will benefit from skilled therapeutic intervention in order to improve the following deficits and impairments:   Body Structure / Function / Physical Skills: UE functional use, ROM, Pain, ADL       Visit Diagnosis: Stiffness of right hand, not elsewhere classified  Pain in right hand  Other symptoms and signs involving the musculoskeletal system    Problem List Patient Active Problem List   Diagnosis Date Noted   Carotid stenosis, asymptomatic 10/20/2015     [place], [place] Student [phone]   02/08/2020, 12:51 PM  [place], [name], [address] Phone: [phone]   Fax:  [phone]  Name: DIERRE CREVIER MRN: [id number] Date of Birth: December 09, 1940

## 2020-02-08 NOTE — Patient Instructions (Signed)
AROM: DIP Flexion / Extension   Pinch middle knuckle of each finger of right hand to prevent bending. Bend end knuckle until stretch is felt. Hold 3 seconds. Relax. Straighten finger as far as possible.   Copyright  VHI. All rights reserved.    AROM: PIP Flexion / Extension   Pinch bottom knuckle of each finger of right hand to prevent bending. Actively bend middle knuckle until stretch is felt. Hold 3 seconds. Relax. Straighten finger as far as possible.   Copyright  VHI. All rights reserved.   AROM: Finger Flexion / Extension   Actively bend fingers of right hand. Start with knuckles furthest from palm, and slowly make a fist. Hold 3 seconds. Relax. Then straighten fingers as far as possible.   Copyright  VHI. All rights reserved.   Paper Crumpling Exercise   Begin with right palm down on piece of paper. Maintaining contact between surface and heel of hand, crumple paper into a ball.   Copyright  VHI. All rights reserved.     Towel Roll Squeeze   With right forearm resting on surface, gently squeeze towel.   Copyright  VHI. All rights reserved.   Abduction / Adduction (Active)    With hand flat on table, spread all fingers apart, then bring them together as close as possible.   Copyright  VHI. All rights reserved.  AROM: Thumb Abduction / Adduction   Actively pull right thumb away from palm as far as possible. Hold 3 seconds. Then bring thumb back to touch fingers. Try not to bend fingers toward thumb.   Copyright  VHI. All rights reserved.     Home Exercises Program Theraputty Exercises  Do the following exercises at least once a day using your affected hand.  1. Roll putty into a ball.  2. Make into a pancake.  3. Roll putty into a roll.  4. Pinch along log with first finger and thumb.   5. Make into a ball.  6. Roll it back into a log.   7. Pinch using thumb and side of first finger.  8. Roll into a ball, then flatten into a  pancake.  9. Using your fingers, make putty into a mountain.  10. Roll putty back into a ball and squeeze gently for 2-3 minutes.

## 2020-02-08 NOTE — Addendum Note (Signed)
Addended by: Ailene Ravel D on: 02/08/2020 01:24 PM   Modules accepted: Orders

## 2020-02-11 DIAGNOSIS — M10061 Idiopathic gout, right knee: Secondary | ICD-10-CM | POA: Diagnosis not present

## 2020-03-07 DIAGNOSIS — M109 Gout, unspecified: Secondary | ICD-10-CM | POA: Diagnosis not present

## 2020-03-07 DIAGNOSIS — E785 Hyperlipidemia, unspecified: Secondary | ICD-10-CM | POA: Diagnosis not present

## 2020-03-07 DIAGNOSIS — I10 Essential (primary) hypertension: Secondary | ICD-10-CM | POA: Diagnosis not present

## 2020-03-07 DIAGNOSIS — N183 Chronic kidney disease, stage 3 unspecified: Secondary | ICD-10-CM | POA: Diagnosis not present

## 2020-03-07 DIAGNOSIS — Z79899 Other long term (current) drug therapy: Secondary | ICD-10-CM | POA: Diagnosis not present

## 2020-03-14 DIAGNOSIS — M1000 Idiopathic gout, unspecified site: Secondary | ICD-10-CM | POA: Diagnosis not present

## 2020-03-14 DIAGNOSIS — R7309 Other abnormal glucose: Secondary | ICD-10-CM | POA: Diagnosis not present

## 2020-03-14 DIAGNOSIS — E1122 Type 2 diabetes mellitus with diabetic chronic kidney disease: Secondary | ICD-10-CM | POA: Diagnosis not present

## 2020-03-14 DIAGNOSIS — N1831 Chronic kidney disease, stage 3a: Secondary | ICD-10-CM | POA: Diagnosis not present

## 2020-04-18 DIAGNOSIS — M1000 Idiopathic gout, unspecified site: Secondary | ICD-10-CM | POA: Diagnosis not present

## 2020-05-05 ENCOUNTER — Encounter: Payer: Self-pay | Admitting: Internal Medicine

## 2020-05-30 DIAGNOSIS — E119 Type 2 diabetes mellitus without complications: Secondary | ICD-10-CM | POA: Diagnosis not present

## 2020-05-30 DIAGNOSIS — N189 Chronic kidney disease, unspecified: Secondary | ICD-10-CM | POA: Diagnosis not present

## 2020-05-30 DIAGNOSIS — E785 Hyperlipidemia, unspecified: Secondary | ICD-10-CM | POA: Diagnosis not present

## 2020-05-30 DIAGNOSIS — I10 Essential (primary) hypertension: Secondary | ICD-10-CM | POA: Diagnosis not present

## 2020-06-21 ENCOUNTER — Emergency Department (HOSPITAL_COMMUNITY): Payer: Medicare Other

## 2020-06-21 ENCOUNTER — Other Ambulatory Visit: Payer: Self-pay

## 2020-06-21 ENCOUNTER — Emergency Department (HOSPITAL_COMMUNITY)
Admission: EM | Admit: 2020-06-21 | Discharge: 2020-06-21 | Disposition: A | Discharge status: Home / Self Care | Payer: Medicare Other | Attending: Emergency Medicine | Admitting: Emergency Medicine

## 2020-06-21 ENCOUNTER — Encounter (HOSPITAL_COMMUNITY): Payer: Self-pay | Admitting: Emergency Medicine

## 2020-06-21 DIAGNOSIS — K5641 Fecal impaction: Secondary | ICD-10-CM | POA: Insufficient documentation

## 2020-06-21 DIAGNOSIS — I129 Hypertensive chronic kidney disease with stage 1 through stage 4 chronic kidney disease, or unspecified chronic kidney disease: Secondary | ICD-10-CM | POA: Insufficient documentation

## 2020-06-21 DIAGNOSIS — I2510 Atherosclerotic heart disease of native coronary artery without angina pectoris: Secondary | ICD-10-CM | POA: Insufficient documentation

## 2020-06-21 DIAGNOSIS — N183 Chronic kidney disease, stage 3 unspecified: Secondary | ICD-10-CM | POA: Insufficient documentation

## 2020-06-21 DIAGNOSIS — Z7982 Long term (current) use of aspirin: Secondary | ICD-10-CM | POA: Insufficient documentation

## 2020-06-21 DIAGNOSIS — Z79899 Other long term (current) drug therapy: Secondary | ICD-10-CM | POA: Insufficient documentation

## 2020-06-21 DIAGNOSIS — R195 Other fecal abnormalities: Secondary | ICD-10-CM | POA: Diagnosis present

## 2020-06-21 DIAGNOSIS — E1122 Type 2 diabetes mellitus with diabetic chronic kidney disease: Secondary | ICD-10-CM | POA: Insufficient documentation

## 2020-06-21 DIAGNOSIS — K5900 Constipation, unspecified: Secondary | ICD-10-CM | POA: Diagnosis not present

## 2020-06-21 DIAGNOSIS — R109 Unspecified abdominal pain: Secondary | ICD-10-CM | POA: Diagnosis not present

## 2020-06-21 DIAGNOSIS — Z87891 Personal history of nicotine dependence: Secondary | ICD-10-CM | POA: Insufficient documentation

## 2020-06-21 LAB — CBC WITH DIFFERENTIAL/PLATELET
Abs Immature Granulocytes: 0.02 10*3/uL (ref 0.00–0.07)
Basophils Absolute: 0 10*3/uL (ref 0.0–0.1)
Basophils Relative: 0 %
Eosinophils Absolute: 0 10*3/uL (ref 0.0–0.5)
Eosinophils Relative: 0 %
HCT: 44.8 % (ref 39.0–52.0)
Hemoglobin: 13.9 g/dL (ref 13.0–17.0)
Immature Granulocytes: 0 %
Lymphocytes Relative: 24 %
Lymphs Abs: 1.4 10*3/uL (ref 0.7–4.0)
MCH: 28.9 pg (ref 26.0–34.0)
MCHC: 31 g/dL (ref 30.0–36.0)
MCV: 93.1 fL (ref 80.0–100.0)
Monocytes Absolute: 0.5 10*3/uL (ref 0.1–1.0)
Monocytes Relative: 9 %
Neutro Abs: 3.9 10*3/uL (ref 1.7–7.7)
Neutrophils Relative %: 67 %
Platelets: 212 10*3/uL (ref 150–400)
RBC: 4.81 MIL/uL (ref 4.22–5.81)
RDW: 13.7 % (ref 11.5–15.5)
WBC: 6 10*3/uL (ref 4.0–10.5)
nRBC: 0 % (ref 0.0–0.2)

## 2020-06-21 LAB — BASIC METABOLIC PANEL
Anion gap: 9 (ref 5–15)
BUN: 20 mg/dL (ref 8–23)
CO2: 25 mmol/L (ref 22–32)
Calcium: 9.2 mg/dL (ref 8.9–10.3)
Chloride: 106 mmol/L (ref 98–111)
Creatinine, Ser: 1.35 mg/dL — ABNORMAL HIGH (ref 0.61–1.24)
GFR, Estimated: 53 mL/min — ABNORMAL LOW (ref >60)
Glucose, Bld: 95 mg/dL (ref 70–99)
Potassium: 4.4 mmol/L (ref 3.5–5.1)
Sodium: 140 mmol/L (ref 135–145)

## 2020-06-21 MED ORDER — BISACODYL 10 MG RE SUPP: 10.0000 mg | Freq: Once | RECTAL | Status: DC

## 2020-06-21 MED ORDER — FLEET ENEMA 7-19 GM/118ML RE ENEM
1.0000 | ENEMA | Freq: Once | RECTAL | Status: AC
  Administered 2020-06-21: 1 via RECTAL

## 2020-06-21 MED ORDER — BISACODYL 10 MG RE SUPP: 10.0000 mg | RECTAL | 0 refills | Status: DC | PRN

## 2020-06-21 NOTE — ED Triage Notes (Signed)
History obtain from sister.  Dr. Leta Baptist sent the patient for manual disimpaction.   Pt states he has no pain and is ready to go.

## 2020-06-21 NOTE — ED Notes (Signed)
Entered room and introduced self to patient. Pt appears to be resting in bed, respirations are even and unlabored with equal chest rise and fall. Bed is locked in the lowest position, side rails x2, call bell within reach. Pt educated on call light use and hourly rounding, verbalized understanding and in agreement at this time. All questions and concerns voiced addressed. Refreshments offered and provided per patient request.  

## 2020-06-21 NOTE — ED Notes (Signed)
ED Provider at bedside. 

## 2020-06-21 NOTE — ED Provider Notes (Signed)
[facility] EMERGENCY DEPARTMENT Provider Note   CSN: [id number] Arrival date & time: 06/21/20  1057     History Chief Complaint  Patient presents with  . Constipation    Miguel Webb is a 80 y.o. male with history of HTN, CAD, chronic kidney disease and Type 2 DM presenting with constipation.  He reports having increasing hard stools and having to strain to have a bm which has worsened over the past several weeks.  He was recently started on miralax by his pcp but states so far it has not helped.  He denies abdominal pain, but endorses intermittent cramping pain at his rectum.  He was seen by his pcp today and found to be impacted so sent here for treatment. He denies fevers or chills, no n/v or difficulty urinating.  He has found no alleviators his symptoms.  Wife states he occasionally sees blood in his stool when he strains hard.  His last bm was hard, small and 2 weeks ago.  The history is provided by the patient and the spouse.       Past Medical History:  Diagnosis Date  . Arthritis   . Carotid artery disease ([place])   . CKD (chronic kidney disease) stage 3, GFR 30-59 ml/min (HCC)   . Erectile dysfunction   . Essential hypertension   . Hyperlipidemia   . Proteinuria   . Type 2 diabetes mellitus [facility])     Patient Active Problem List   Diagnosis Date Noted  . Carotid stenosis, asymptomatic 10/20/2015    Past Surgical History:  Procedure Laterality Date  . CIRCUMCISION    . COLONOSCOPY N/A 05/11/2015   Procedure: COLONOSCOPY;  Surgeon: [name], MD;  [facility];  Service: Endoscopy;  Laterality: N/A;  230   . ENDARTERECTOMY Left 10/20/2015   Procedure: Left CAROTID Endartectomy;  Surgeon: [name], MD;  Location: [place];  Service: Vascular;  Laterality: Left;  . PATCH ANGIOPLASTY Left 10/20/2015   Procedure: PATCH ANGIOPLASTY;  Surgeon: [name], MD;  Location: [place];  Service: Vascular;  Laterality: Left;  . WISDOM TOOTH  EXTRACTION         Family History  Problem Relation Age of Onset  . Peptic Ulcer Disease Mother   . Stroke Father   . Hypertension Father   . Breast cancer Sister   . Hypertension Sister   . Prostate cancer Brother   . Hypertension Brother     Social History   Tobacco Use  . Smoking status: Former Smoker    Types: Cigarettes, Pipe  . Smokeless tobacco: Former Systems developer    Types: Chew  . Tobacco comment: Quit smoking cigarettes and Pipe 28-30 years ago  Vaping Use  . Vaping Use: Never used  Substance Use Topics  . Alcohol use: Yes    Comment: 1-2 times per week drinks a little vodka  . Drug use: No    Home Medications Prior to Admission medications   Medication Sig Start Date End Date Taking? Authorizing Provider  bisacodyl (DULCOLAX) 10 MG suppository Place 1 suppository (10 mg total) rectally as needed for moderate constipation. 06/21/20  Yes IdolAlmyra Free, PA-C  aspirin 81 MG tablet Take 81 mg by mouth daily.    [provider]  lisinopril (PRINIVIL,ZESTRIL) 20 MG tablet Take 20 mg by mouth daily.    [provider]  metoprolol succinate (TOPROL-XL) 25 MG 24 hr tablet TAKE 1/2 TABLET BY MOUTH EVERY DAY 07/22/19   Domenic Polite,  Aloha Gell, MD  polyethylene glycol-electrolytes (TRILYTE) 420 g solution Take 4,000 mLs by mouth as directed. 04/26/15   Rourk, Cristopher Estimable, MD  simvastatin (ZOCOR) 20 MG tablet Take 20 mg by mouth daily.    [provider]    Allergies    Patient has no known allergies.  Review of Systems   Review of Systems  Constitutional: Negative for chills and fever.  HENT: Negative for congestion and sore throat.   Eyes: Negative.   Respiratory: Negative for chest tightness and shortness of breath.   Cardiovascular: Negative for chest pain.  Gastrointestinal: Positive for blood in stool, constipation and rectal pain. Negative for abdominal pain, nausea and vomiting.  Genitourinary: Negative.   Musculoskeletal: Negative for arthralgias,  joint swelling and neck pain.  Skin: Negative.  Negative for rash and wound.  Neurological: Negative for dizziness, weakness, light-headedness, numbness and headaches.  Psychiatric/Behavioral: Negative.     Physical Exam Updated Vital Signs BP 124/86 (BP Location: Right Arm)   Pulse 72   Temp 97.9 F (36.6 C) (Oral)   Resp 16   Ht 5\' 8"  (1.727 m)   Wt 62.2 kg   SpO2 100%   BMI 20.85 kg/m   Physical Exam Vitals and nursing note reviewed. Exam conducted with a chaperone present.  Constitutional:      Appearance: He is well-developed.     Comments: cachectic  HENT:     Head: Normocephalic and atraumatic.  Eyes:     Conjunctiva/sclera: Conjunctivae normal.  Cardiovascular:     Rate and Rhythm: Normal rate and regular rhythm.     Heart sounds: Normal heart sounds.  Pulmonary:     Effort: Pulmonary effort is normal.     Breath sounds: Normal breath sounds. No wheezing.  Abdominal:     General: Bowel sounds are normal.     Palpations: Abdomen is soft.     Tenderness: There is no abdominal tenderness.  Genitourinary:    Rectum: No external hemorrhoid.     Comments: Hard stool impaction felt high in rectum.  Unable to reach for manual disimpaction. Musculoskeletal:        General: Normal range of motion.     Cervical back: Normal range of motion.  Skin:    General: Skin is warm and dry.  Neurological:     General: No focal deficit present.     Mental Status: He is alert.     ED Results / Procedures / Treatments   Labs (all labs ordered are listed, but only abnormal results are displayed) Labs Reviewed  BASIC METABOLIC PANEL - Abnormal; Notable for the following components:      Result Value   Creatinine, Ser 1.35 (*)    GFR, Estimated 53 (*)    All other components within normal limits  CBC WITH DIFFERENTIAL/PLATELET    EKG None  Radiology DG Abdomen 1 View  Result Date: 06/21/2020 CLINICAL DATA:  Abdominal pain and constipation. EXAM: ABDOMEN - 1 VIEW  COMPARISON:  None. FINDINGS: No evidence of dilated bowel loops. No significant stool burden noted. No radio-opaque calculi or other significant radiographic abnormality are seen. IMPRESSION: Negative. Electronically Signed   By: Marlaine Hind M.D.   On: 06/21/2020 15:53    Procedures Procedures   Medications Ordered in ED Medications  sodium phosphate (FLEET) 7-19 GM/118ML enema 1 enema (1 enema Rectal Given 06/21/20 1639)    ED Course  I have reviewed the triage vital signs and the nursing notes.  Pertinent labs &  imaging results that were available during my care of the patient were reviewed by me and considered in my medical decision making (see chart for details).    MDM Rules/Calculators/A&P                          Pt with rectal stool impaction but unable to reach for meaningful disimpaction.  Labs, imaging, enema ordered.  Labs reassuring, specifically normal K+, Kub with no significant stool burden noted.  He was given a fleets enema to try to soften and expel the stool bolus felt on exam without results.  He was then offered an additional enema using a different combination but he refused additional enema's.  Suggested dulcolax suppository which may help soften the stool burden, again pt refused stating he was ready to go home.  He was given a prescription of dulcolax for home use, he also already has miralax at home, advised to take daily.    Strict return precautions outlined for worsened pain, or new sx such as n/v, fevers. Final Clinical Impression(s) / ED Diagnoses Final diagnoses:  Fecal impaction in rectum [facility])    Rx / DC Orders ED Discharge Orders         Ordered    bisacodyl (DULCOLAX) 10 MG suppository  As needed        06/21/20 1730           Evalee Jefferson, Hershal Coria 06/22/20 1607    Sherwood Gambler, MD 06/24/20 [phone]

## 2020-06-21 NOTE — ED Notes (Signed)
Pt refusing vitals.

## 2020-06-21 NOTE — ED Notes (Signed)
X Ray at bedside at this time.  

## 2020-06-21 NOTE — ED Notes (Signed)
This RN attempted to contact daughter Mable Fill at [phone] without success. Will attempt to provide an update at a later time.

## 2020-06-21 NOTE — Discharge Instructions (Addendum)
As discussed,  I have recommended an additional enema since the first one was not effective, but I understand your desire to go home.  You have been given a dulcolax suppository which hopefully will soften this stool enough to help promote a bowel movement.  I recommend you continue taking your miralax as well to prevent future problems with constipation. Return here if you develop any worsening symptoms.

## 2020-06-21 NOTE — ED Notes (Signed)
This RN to bedside, found patient pt be changing into clothes, stating "it didn't work." This RN noticed that patient had a scant amount of stool but was unable to have a full, solid bowel movement. PA notified at this time.

## 2020-07-02 ENCOUNTER — Encounter (HOSPITAL_COMMUNITY): Payer: Self-pay | Admitting: Emergency Medicine

## 2020-07-02 ENCOUNTER — Other Ambulatory Visit: Payer: Self-pay

## 2020-07-02 ENCOUNTER — Emergency Department (HOSPITAL_COMMUNITY)
Admission: EM | Admit: 2020-07-02 | Discharge: 2020-07-02 | Disposition: A | Discharge status: Home / Self Care | Payer: Medicare Other | Attending: Emergency Medicine | Admitting: Emergency Medicine

## 2020-07-02 ENCOUNTER — Emergency Department (HOSPITAL_COMMUNITY): Payer: Medicare Other

## 2020-07-02 DIAGNOSIS — Z87891 Personal history of nicotine dependence: Secondary | ICD-10-CM | POA: Diagnosis not present

## 2020-07-02 DIAGNOSIS — Z79899 Other long term (current) drug therapy: Secondary | ICD-10-CM | POA: Diagnosis not present

## 2020-07-02 DIAGNOSIS — R1030 Lower abdominal pain, unspecified: Secondary | ICD-10-CM | POA: Insufficient documentation

## 2020-07-02 DIAGNOSIS — N183 Chronic kidney disease, stage 3 unspecified: Secondary | ICD-10-CM | POA: Insufficient documentation

## 2020-07-02 DIAGNOSIS — Z7982 Long term (current) use of aspirin: Secondary | ICD-10-CM | POA: Insufficient documentation

## 2020-07-02 DIAGNOSIS — K5900 Constipation, unspecified: Secondary | ICD-10-CM | POA: Insufficient documentation

## 2020-07-02 DIAGNOSIS — K5909 Other constipation: Secondary | ICD-10-CM | POA: Diagnosis not present

## 2020-07-02 DIAGNOSIS — R109 Unspecified abdominal pain: Secondary | ICD-10-CM | POA: Diagnosis not present

## 2020-07-02 DIAGNOSIS — K5641 Fecal impaction: Secondary | ICD-10-CM | POA: Diagnosis not present

## 2020-07-02 DIAGNOSIS — E1122 Type 2 diabetes mellitus with diabetic chronic kidney disease: Secondary | ICD-10-CM | POA: Insufficient documentation

## 2020-07-02 DIAGNOSIS — I129 Hypertensive chronic kidney disease with stage 1 through stage 4 chronic kidney disease, or unspecified chronic kidney disease: Secondary | ICD-10-CM | POA: Diagnosis not present

## 2020-07-02 HISTORY — DX: Fecal impaction: K56.41

## 2020-07-02 LAB — CBC
HCT: 45.6 % (ref 39.0–52.0)
Hemoglobin: 14.1 g/dL (ref 13.0–17.0)
MCH: 28.6 pg (ref 26.0–34.0)
MCHC: 30.9 g/dL (ref 30.0–36.0)
MCV: 92.5 fL (ref 80.0–100.0)
Platelets: 237 10*3/uL (ref 150–400)
RBC: 4.93 MIL/uL (ref 4.22–5.81)
RDW: 13.6 % (ref 11.5–15.5)
WBC: 7.2 10*3/uL (ref 4.0–10.5)
nRBC: 0 % (ref 0.0–0.2)

## 2020-07-02 LAB — COMPREHENSIVE METABOLIC PANEL
ALT: 20 U/L (ref 0–44)
AST: 22 U/L (ref 15–41)
Albumin: 4.2 g/dL (ref 3.5–5.0)
Alkaline Phosphatase: 51 U/L (ref 38–126)
Anion gap: 12 (ref 5–15)
BUN: 20 mg/dL (ref 8–23)
CO2: 23 mmol/L (ref 22–32)
Calcium: 9.4 mg/dL (ref 8.9–10.3)
Chloride: 104 mmol/L (ref 98–111)
Creatinine, Ser: 1.3 mg/dL — ABNORMAL HIGH (ref 0.61–1.24)
GFR, Estimated: 56 mL/min — ABNORMAL LOW (ref >60)
Glucose, Bld: 130 mg/dL — ABNORMAL HIGH (ref 70–99)
Potassium: 4.3 mmol/L (ref 3.5–5.1)
Sodium: 139 mmol/L (ref 135–145)
Total Bilirubin: 0.6 mg/dL (ref 0.3–1.2)
Total Protein: 7.8 g/dL (ref 6.5–8.1)

## 2020-07-02 LAB — URINALYSIS, ROUTINE W REFLEX MICROSCOPIC
Bacteria, UA: NONE SEEN
Bilirubin Urine: NEGATIVE
Glucose, UA: NEGATIVE mg/dL
Ketones, ur: NEGATIVE mg/dL
Leukocytes,Ua: NEGATIVE
Nitrite: NEGATIVE
Protein, ur: NEGATIVE mg/dL
Specific Gravity, Urine: 1.018 (ref 1.005–1.030)
pH: 6 (ref 5.0–8.0)

## 2020-07-02 MED ORDER — IOHEXOL 9 MG/ML PO SOLN
ORAL | Status: AC
  Filled 2020-07-02: qty 1000

## 2020-07-02 MED ORDER — MILK AND MOLASSES ENEMA
1.0000 | Freq: Once | RECTAL | Status: DC
  Filled 2020-07-02: qty 240

## 2020-07-02 MED ORDER — SODIUM CHLORIDE 0.9 % IV BOLUS
1000.0000 mL | Freq: Once | INTRAVENOUS | Status: AC
  Administered 2020-07-02: 1000 mL via INTRAVENOUS

## 2020-07-02 MED ORDER — IOHEXOL 300 MG/ML  SOLN
75.0000 mL | Freq: Once | INTRAMUSCULAR | Status: AC | PRN
  Administered 2020-07-02: 75 mL via INTRAVENOUS

## 2020-07-02 NOTE — ED Notes (Signed)
Pt have very large brown bm, md notified.

## 2020-07-02 NOTE — Discharge Instructions (Signed)
It was our pleasure to provide your ER care today - we hope that you feel better.  Take colace (stool softener) 2x/day. Take miralax (laxative) once a day - these meds are available over the counter.   Drink plenty of fluids/stay well hydrated. Get adequate fiber in diet.   Follow up with your doctor in the next 1-2 weeks - also have blood pressure rechecked as it is high today.  Return to ER if worse, new symptoms, fevers, new, worsening or severe abdominal pain, persistent vomiting, or other concern.

## 2020-07-02 NOTE — ED Provider Notes (Signed)
[facility] EMERGENCY DEPARTMENT Provider Note   CSN: [id number] Arrival date & time: 07/02/20  1749     History Chief Complaint  Patient presents with  . Abdominal Pain    Miguel Webb is a 80 y.o. male.  Patient c/o mid to lower abd pain in the past few days. Symptoms acute onset, moderate, constant, persistent, dull, non radiating. Recent ED visit w impaction/constipation - states had small bm subsequently. Last bm a few days ago. No abd distension or vomiting. No dysuria or hematuria. No back or flank pain. No fever or chills.   The history is provided by the patient and a relative. The history is limited by the condition of the patient.  Abdominal Pain Associated symptoms: constipation   Associated symptoms: no chest pain, no chills, no cough, no diarrhea, no dysuria, no fever, no shortness of breath, no sore throat and no vomiting        Past Medical History:  Diagnosis Date  . Arthritis   . Carotid artery disease ([place])   . CKD (chronic kidney disease) stage 3, GFR 30-59 ml/min (HCC)   . Erectile dysfunction   . Essential hypertension   . Fecal impaction ([place])   . Hyperlipidemia   . Proteinuria   . Type 2 diabetes mellitus [facility])     Patient Active Problem List   Diagnosis Date Noted  . Carotid stenosis, asymptomatic 10/20/2015    Past Surgical History:  Procedure Laterality Date  . CIRCUMCISION    . COLONOSCOPY N/A 05/11/2015   Procedure: COLONOSCOPY;  Surgeon: [name], MD;  [facility];  Service: Endoscopy;  Laterality: N/A;  230   . ENDARTERECTOMY Left 10/20/2015   Procedure: Left CAROTID Endartectomy;  Surgeon: [name], MD;  Location: [place];  Service: Vascular;  Laterality: Left;  . PATCH ANGIOPLASTY Left 10/20/2015   Procedure: PATCH ANGIOPLASTY;  Surgeon: [name], MD;  Location: [place];  Service: Vascular;  Laterality: Left;  . WISDOM TOOTH EXTRACTION         Family History  Problem Relation Age of  Onset  . Peptic Ulcer Disease Mother   . Stroke Father   . Hypertension Father   . Breast cancer Sister   . Hypertension Sister   . Prostate cancer Brother   . Hypertension Brother     Social History   Tobacco Use  . Smoking status: Former Smoker    Types: Cigarettes, Pipe  . Smokeless tobacco: Former Systems developer    Types: Chew  . Tobacco comment: Quit smoking cigarettes and Pipe 28-30 years ago  Vaping Use  . Vaping Use: Never used  Substance Use Topics  . Alcohol use: Yes    Comment: 1-2 times per week drinks a little vodka  . Drug use: No    Home Medications Prior to Admission medications   Medication Sig Start Date End Date Taking? Authorizing Provider  aspirin 81 MG tablet Take 81 mg by mouth daily.    [provider]  bisacodyl (DULCOLAX) 10 MG suppository Place 1 suppository (10 mg total) rectally as needed for moderate constipation. 06/21/20   Evalee Jefferson, PA-C  lisinopril (PRINIVIL,ZESTRIL) 20 MG tablet Take 20 mg by mouth daily.    [provider]  metoprolol succinate (TOPROL-XL) 25 MG 24 hr tablet TAKE 1/2 TABLET BY MOUTH EVERY DAY 07/22/19   Satira Sark, MD  polyethylene glycol-electrolytes (TRILYTE) 420 g solution Take 4,000 mLs by mouth as directed. 04/26/15   Rourk,  Cristopher Estimable, MD  simvastatin (ZOCOR) 20 MG tablet Take 20 mg by mouth daily.    [provider]    Allergies    Patient has no known allergies.  Review of Systems   Review of Systems  Constitutional: Negative for chills and fever.  HENT: Negative for sore throat.   Eyes: Negative for redness.  Respiratory: Negative for cough and shortness of breath.   Cardiovascular: Negative for chest pain.  Gastrointestinal: Positive for abdominal pain and constipation. Negative for diarrhea and vomiting.  Genitourinary: Negative for dysuria and flank pain.  Musculoskeletal: Negative for back pain and neck pain.  Skin: Negative for rash.  Neurological: Negative for headaches.   Hematological: Does not bruise/bleed easily.  Psychiatric/Behavioral: Negative for agitation.    Physical Exam Updated Vital Signs BP (!) 141/86 (BP Location: Right Arm)   Pulse 92   Temp 98.3 F (36.8 C) (Oral)   Resp 18   Ht 1.689 m (5' 6.5")   Wt 53.5 kg   SpO2 99%   BMI 18.76 kg/m   Physical Exam Vitals and nursing note reviewed.  Constitutional:      Appearance: Normal appearance. He is well-developed.  HENT:     Head: Atraumatic.     Nose: Nose normal.     Mouth/Throat:     Mouth: Mucous membranes are moist.     Pharynx: Oropharynx is clear.  Eyes:     General: No scleral icterus.    Conjunctiva/sclera: Conjunctivae normal.  Neck:     Trachea: No tracheal deviation.  Cardiovascular:     Rate and Rhythm: Normal rate and regular rhythm.     Pulses: Normal pulses.     Heart sounds: Normal heart sounds. No murmur heard. No friction rub. No gallop.   Pulmonary:     Effort: Pulmonary effort is normal. No accessory muscle usage or respiratory distress.     Breath sounds: Normal breath sounds.  Abdominal:     General: Bowel sounds are normal. There is no distension.     Palpations: Abdomen is soft. There is no mass.     Tenderness: There is abdominal tenderness. There is no guarding.     Hernia: No hernia is present.     Comments: Lower abd tenderness. No puls mass.   Genitourinary:    Comments: No cva tenderness. Mildly firm, formed stool, abundant, in rectum, medium brown. Manually disimpacted as much as could reach and pt tolerate. No mass felt. No abscess.  Musculoskeletal:        General: No swelling.     Cervical back: Normal range of motion and neck supple. No rigidity.  Skin:    General: Skin is warm and dry.     Findings: No rash.  Neurological:     Mental Status: He is alert.     Comments: Alert, speech clear.   Psychiatric:        Mood and Affect: Mood normal.     ED Results / Procedures / Treatments   Labs (all labs ordered are listed, but  only abnormal results are displayed) Results for orders placed or performed during the hospital encounter of 88/50/27  Basic metabolic panel  Result Value Ref Range   Sodium 140 135 - 145 mmol/L   Potassium 4.4 3.5 - 5.1 mmol/L   Chloride 106 98 - 111 mmol/L   CO2 25 22 - 32 mmol/L   Glucose, Bld 95 70 - 99 mg/dL   BUN 20 8 - 23 mg/dL  Creatinine, Ser 1.35 (H) 0.61 - 1.24 mg/dL   Calcium 9.2 8.9 - 10.3 mg/dL   GFR, Estimated 53 (L) >60 mL/min   Anion gap 9 5 - 15  CBC with Differential/Platelet  Result Value Ref Range   WBC 6.0 4.0 - 10.5 K/uL   RBC 4.81 4.22 - 5.81 MIL/uL   Hemoglobin 13.9 13.0 - 17.0 g/dL   HCT 44.8 39.0 - 52.0 %   MCV 93.1 80.0 - 100.0 fL   MCH 28.9 26.0 - 34.0 pg   MCHC 31.0 30.0 - 36.0 g/dL   RDW 13.7 11.5 - 15.5 %   Platelets 212 150 - 400 K/uL   nRBC 0.0 0.0 - 0.2 %   Neutrophils Relative % 67 %   Neutro Abs 3.9 1.7 - 7.7 K/uL   Lymphocytes Relative 24 %   Lymphs Abs 1.4 0.7 - 4.0 K/uL   Monocytes Relative 9 %   Monocytes Absolute 0.5 0.1 - 1.0 K/uL   Eosinophils Relative 0 %   Eosinophils Absolute 0.0 0.0 - 0.5 K/uL   Basophils Relative 0 %   Basophils Absolute 0.0 0.0 - 0.1 K/uL   Immature Granulocytes 0 %   Abs Immature Granulocytes 0.02 0.00 - 0.07 K/uL   DG Abdomen 1 View  Result Date: 06/21/2020 CLINICAL DATA:  Abdominal pain and constipation. EXAM: ABDOMEN - 1 VIEW COMPARISON:  None. FINDINGS: No evidence of dilated bowel loops. No significant stool burden noted. No radio-opaque calculi or other significant radiographic abnormality are seen. IMPRESSION: Negative. Electronically Signed   By: Marlaine Hind M.D.   On: 06/21/2020 15:53    EKG None  Radiology CT Abdomen Pelvis W Contrast  Result Date: 07/02/2020 CLINICAL DATA:  Lower abdominal pain for 3 days. Seen in ED on 06/21/2020 and had fecal impaction which was cleared with an enema. EXAM: CT ABDOMEN AND PELVIS WITH CONTRAST TECHNIQUE: Multidetector CT imaging of the abdomen and pelvis  was performed using the standard protocol following bolus administration of intravenous contrast. CONTRAST:  6mL OMNIPAQUE IOHEXOL 300 MG/ML  SOLN COMPARISON:  None. FINDINGS: Lower chest: Lung bases are clear. Coronary artery and aortic calcifications. Hepatobiliary: Small cysts in the liver. Portal veins are patent. Gallbladder and bile ducts are unremarkable. Pancreas: Unremarkable. No pancreatic ductal dilatation or surrounding inflammatory changes. Spleen: Normal in size without focal abnormality. Adrenals/Urinary Tract: No adrenal gland nodules. Multiple large cysts on both kidneys. Nephrograms are symmetrical. No hydronephrosis or hydroureter. Bladder is unremarkable. Stomach/Bowel: The stomach is distended with fluid and contrast material. Contrast material extends through the small bowel to the colon suggesting no evidence of small bowel obstruction. Marked dilatation of the stool-filled rectum. Rectal wall thickening may indicate stercoral colitis. Prominent stool also in the sigmoid colon. Diverticulosis without evidence of diverticulitis. Appendix is normal. Vascular/Lymphatic: Prominent vascular calcification in the aorta with calcific and noncalcific plaque formation. No aneurysm. No significant lymphadenopathy. Reproductive: Prominent prostate enlargement, measuring 6.8 cm diameter. Other: No free air or free fluid in the abdomen. Abdominal wall musculature appears intact. Musculoskeletal: Degenerative changes in the spine and hips. No destructive bone lesions. IMPRESSION: 1. Marked dilatation of the stool-filled rectum consistent with impaction. Rectal wall thickening may indicate stercoral colitis. 2. Diverticulosis without evidence of diverticulitis. 3. Prominent prostate enlargement. 4. Aortic atherosclerosis. Aortic Atherosclerosis (ICD10-I70.0). Electronically Signed   By: Lucienne Capers M.D.   On: 07/02/2020 21:27    Procedures Procedures   Medications Ordered in ED Medications   sodium chloride 0.9 % bolus 1,000  mL (has no administration in time range)  iohexol (OMNIPAQUE) 9 MG/ML oral solution (has no administration in time range)    ED Course  I have reviewed the triage vital signs and the nursing notes.  Pertinent labs & imaging results that were available during my care of the patient were reviewed by me and considered in my medical decision making (see chart for details).    MDM Rules/Calculators/A&P                         Iv ns bolus. Labs sent. Imaging ordered.   Reviewed nursing notes and prior charts for additional history.   Labs reviewed/interpreted by me - wbc normal.   CT reviewed/interpreted by me - ?impaction, no other bowel obstruction.   Post manual disimpaction and prior to enema, pt with extremely large bowel movement. Feels much improved. abd soft nt.   Pt currently appears stable for d/c.   Return precautions provided.      Final Clinical Impression(s) / ED Diagnoses Final diagnoses:  None    Rx / DC Orders ED Discharge Orders    None       Lajean Saver, MD 07/02/20 2253

## 2020-07-02 NOTE — ED Provider Notes (Signed)
MSE was initiated and I personally evaluated the patient and placed orders (if any) at  6:37 PM on July 02, 2020.  The patient appears stable so that the remainder of the MSE may be completed by another provider.     Lajean Saver, MD 07/02/20 [phone]

## 2020-07-02 NOTE — ED Notes (Signed)
Pt in bed, pt denies pain, states that "I feel like a new man"

## 2020-07-02 NOTE — ED Triage Notes (Signed)
Patient c/o lower abd pain x2-3 days. Denies any nausea, vomiting, or diarrhea. Per family patient seen in ED 3/22 and had Fecal impaction. Patient had enema and part of impaction was removed. Patient was to receive another enema and be manually disimpacted but per family patient became impatient and wanted to go home and left. Patient has dementia per daughter.

## 2020-07-30 DIAGNOSIS — I10 Essential (primary) hypertension: Secondary | ICD-10-CM | POA: Diagnosis not present

## 2020-07-30 DIAGNOSIS — E785 Hyperlipidemia, unspecified: Secondary | ICD-10-CM | POA: Diagnosis not present

## 2020-07-30 DIAGNOSIS — N189 Chronic kidney disease, unspecified: Secondary | ICD-10-CM | POA: Diagnosis not present

## 2020-07-30 DIAGNOSIS — E119 Type 2 diabetes mellitus without complications: Secondary | ICD-10-CM | POA: Diagnosis not present

## 2020-09-23 DIAGNOSIS — N183 Chronic kidney disease, stage 3 unspecified: Secondary | ICD-10-CM | POA: Diagnosis not present

## 2020-09-23 DIAGNOSIS — E1129 Type 2 diabetes mellitus with other diabetic kidney complication: Secondary | ICD-10-CM | POA: Diagnosis not present

## 2020-09-23 DIAGNOSIS — Z79899 Other long term (current) drug therapy: Secondary | ICD-10-CM | POA: Diagnosis not present

## 2020-09-23 DIAGNOSIS — M109 Gout, unspecified: Secondary | ICD-10-CM | POA: Diagnosis not present

## 2020-09-30 DIAGNOSIS — M109 Gout, unspecified: Secondary | ICD-10-CM | POA: Diagnosis not present

## 2020-09-30 DIAGNOSIS — G309 Alzheimer's disease, unspecified: Secondary | ICD-10-CM | POA: Diagnosis not present

## 2020-09-30 DIAGNOSIS — I10 Essential (primary) hypertension: Secondary | ICD-10-CM | POA: Diagnosis not present

## 2020-09-30 DIAGNOSIS — R7309 Other abnormal glucose: Secondary | ICD-10-CM | POA: Diagnosis not present

## 2020-09-30 DIAGNOSIS — E1122 Type 2 diabetes mellitus with diabetic chronic kidney disease: Secondary | ICD-10-CM | POA: Diagnosis not present

## 2020-09-30 DIAGNOSIS — N1832 Chronic kidney disease, stage 3b: Secondary | ICD-10-CM | POA: Diagnosis not present

## 2020-10-30 DIAGNOSIS — N189 Chronic kidney disease, unspecified: Secondary | ICD-10-CM | POA: Diagnosis not present

## 2020-10-30 DIAGNOSIS — E119 Type 2 diabetes mellitus without complications: Secondary | ICD-10-CM | POA: Diagnosis not present

## 2020-10-30 DIAGNOSIS — I10 Essential (primary) hypertension: Secondary | ICD-10-CM | POA: Diagnosis not present

## 2020-10-30 DIAGNOSIS — E785 Hyperlipidemia, unspecified: Secondary | ICD-10-CM | POA: Diagnosis not present

## 2020-11-01 DIAGNOSIS — Z20822 Contact with and (suspected) exposure to covid-19: Secondary | ICD-10-CM | POA: Diagnosis not present

## 2020-11-09 ENCOUNTER — Ambulatory Visit: Payer: Medicare Other | Admitting: Internal Medicine

## 2020-11-09 ENCOUNTER — Ambulatory Visit (HOSPITAL_COMMUNITY): Payer: Medicare Other | Attending: Internal Medicine | Admitting: Physical Therapy

## 2020-11-09 ENCOUNTER — Encounter (HOSPITAL_COMMUNITY): Payer: Self-pay | Admitting: Physical Therapy

## 2020-11-09 ENCOUNTER — Other Ambulatory Visit: Payer: Self-pay

## 2020-11-09 DIAGNOSIS — M6281 Muscle weakness (generalized): Secondary | ICD-10-CM | POA: Insufficient documentation

## 2020-11-09 NOTE — Therapy (Signed)
Miguel Webb, [name], [address] Phone: [phone]   Fax:  [phone]  Physical Therapy Evaluation  Patient Details  Name: Miguel Webb MRN: [id number] Date of Birth: 01/06/1941 Referring Provider (PT): Asencion Noble MD   Encounter Date: 11/09/2020   PT End of Session - 11/09/20 1013     Visit Number 1    Number of Visits 1    Date for PT Re-Evaluation 11/09/20    Authorization Type [employer] Medicare    PT Start Time [phone]    PT Stop Time 1025    PT Time Calculation (min) 37 min    Activity Tolerance Patient tolerated treatment well    Behavior During Therapy [facility] for tasks assessed/performed             Past Medical History:  Diagnosis Date   Arthritis    Carotid artery disease ([place])    CKD (chronic kidney disease) stage 3, GFR 30-59 ml/min (HCC)    Erectile dysfunction    Essential hypertension    Fecal impaction ([place])    Hyperlipidemia    Proteinuria    Type 2 diabetes mellitus ([place])     Past Surgical History:  Procedure Laterality Date   CIRCUMCISION     COLONOSCOPY N/A 05/11/2015   Procedure: COLONOSCOPY;  Surgeon: [name], MD;  [facility];  Service: Endoscopy;  Laterality: N/A;  230    ENDARTERECTOMY Left 10/20/2015   Procedure: Left CAROTID Endartectomy;  Surgeon: [name], MD;  Location: [place];  Service: Vascular;  Laterality: Left;   PATCH ANGIOPLASTY Left 10/20/2015   Procedure: PATCH ANGIOPLASTY;  Surgeon: [name], MD;  Location: [place];  Service: Vascular;  Laterality: Left;   WISDOM TOOTH EXTRACTION      There were no vitals filed for this visit.    Subjective Assessment - 11/09/20 0955     Subjective Patient says he isn't sure why he is here today. He notes possibly some trouble with his arms. When prompted by referral, he states he was having some troubles previously but he feels pretty good now.    Patient Stated Goals None stated     Currently in Pain? No/denies                [facility] PT Assessment - 11/09/20 0001       Assessment   Medical Diagnosis Weakness    Referring Provider (PT) Asencion Noble MD    Prior Therapy Yes      Balance Screen   Has the patient fallen in the past 6 months No      Prior Function   Level of Independence Independent    Vocation Retired      Associate Professor   Overall Cognitive Status Within Functional Limits for tasks assessed      Posture/Postural Control   Posture/Postural Control Postural limitations    Postural Limitations Rounded Shoulders;Forward head      ROM / Strength   AROM / PROM / Strength Strength      Strength   Strength Assessment Site Hip;Knee;Ankle    Right/Left Hip Right;Left    Right Hip Flexion 4+/5    Right Hip Extension 4+/5    Right Hip ABduction 4+/5    Left Hip Flexion 4+/5    Left Hip Extension 4+/5    Left Hip ABduction 4+/5    Right/Left Knee Right;Left    Right Knee Extension 5/5  Left Knee Extension 4+/5    Right/Left Ankle Right;Left    Right Ankle Dorsiflexion 5/5    Left Ankle Dorsiflexion 4+/5      Transfers   Five time sit to stand comments  10.8 sec no use of UEs      Ambulation/Gait   Ambulation/Gait Yes    Ambulation/Gait Assistance 7: Independent    Ambulation Distance (Feet) 305 Feet    Assistive device None    Gait Pattern Decreased stride length;Trunk flexed    Ambulation Surface Level;Indoor    Gait Comments 2MWT      Balance   Balance Assessed Yes      Static Standing Balance   Static Standing Balance -  Activities  Tandam Stance - Right Leg;Tandam Stance - Left Leg    Static Standing - Comment/# of Minutes >20 sec bilateral with min sway with RT foot tandam                        Objective measurements completed on examination: See above findings.               PT Education - 11/09/20 0955     Education Details on evaluation findings and HEP    Person(s) Educated Patient    Methods  Explanation;Handout    Comprehension Verbalized understanding              PT Short Term Goals - 11/09/20 1017       PT SHORT TERM GOAL #1   Title Patient will be independent with initial HEP and self-management strategies to improve functional outcomes    Baseline Reviewed HEP for general strength, posture, and conditioning. Patient verbalized understanding. Issued comprehensive handout    Status Achieved    Target Date 11/09/20                       Plan - 11/09/20 1022     Clinical Impression Statement Patient is an 80 y.o. male who presents to physical therapy for referral of weakness. Patient reporting no significant deficits at present. Performed evaluation with assessment and found strength and functional mobility to be fairly good. Very mild deficit with static balance, but most notable deficit being posture. Educated patient on evaluation findings and discussed comprehensive HEP. Patient issued HEP handout. Plan is 1 x eval only today, issued home program and encouraged patient to follow up with any further quesitons or concerns.    Personal Factors and Comorbidities Age    Examination-Activity Limitations --   none stated   Stability/Clinical Decision Making Stable/Uncomplicated    Clinical Decision Making Low    Rehab Potential Excellent    PT Frequency 1x / week   Eval only   PT Treatment/Interventions ADLs/Self Care Home Management;Therapeutic exercise;Patient/family education    PT Next Visit Plan 1 x eval only with HEP    PT Home Exercise Plan Eval: tandem stance, sit to stand, scap retraction, heel raise    Consulted and Agree with Plan of Care Patient             Patient will benefit from skilled therapeutic intervention in order to improve the following deficits and impairments:  Postural dysfunction, Decreased strength  Visit Diagnosis: Muscle weakness (generalized)     Problem List Patient Active Problem List   Diagnosis Date Noted    Carotid stenosis, asymptomatic 10/20/2015   10:30 AM, 11/09/20 Josue Hector PT DPT  Physical Therapist with [facility]  [phone] [place], [name], [address] Phone: [phone]   Fax:  [phone]  Name: Miguel Webb MRN: [id number] Date of Birth: Jan 16, 1941

## 2020-11-30 DIAGNOSIS — E785 Hyperlipidemia, unspecified: Secondary | ICD-10-CM | POA: Diagnosis not present

## 2020-11-30 DIAGNOSIS — I10 Essential (primary) hypertension: Secondary | ICD-10-CM | POA: Diagnosis not present

## 2020-11-30 DIAGNOSIS — N189 Chronic kidney disease, unspecified: Secondary | ICD-10-CM | POA: Diagnosis not present

## 2020-11-30 DIAGNOSIS — E119 Type 2 diabetes mellitus without complications: Secondary | ICD-10-CM | POA: Diagnosis not present

## 2020-12-04 ENCOUNTER — Other Ambulatory Visit: Payer: Self-pay | Admitting: Cardiology

## 2020-12-21 ENCOUNTER — Other Ambulatory Visit: Payer: Self-pay

## 2020-12-21 ENCOUNTER — Ambulatory Visit: Payer: Medicare Other | Admitting: Internal Medicine

## 2020-12-21 VITALS — BP 142/80 | HR 95 | Ht 66.5 in | Wt 117.0 lb

## 2020-12-21 DIAGNOSIS — I493 Ventricular premature depolarization: Secondary | ICD-10-CM | POA: Diagnosis not present

## 2020-12-21 DIAGNOSIS — I10 Essential (primary) hypertension: Secondary | ICD-10-CM | POA: Diagnosis not present

## 2020-12-21 DIAGNOSIS — I471 Supraventricular tachycardia: Secondary | ICD-10-CM

## 2020-12-21 NOTE — Patient Instructions (Addendum)
Medication Instructions:  Your physician recommends that you continue on your current medications as directed. Please refer to the Current Medication list given to you today.  Labwork: None ordered.  Testing/Procedures: None ordered.  Follow-Up: Your physician wants you to follow-up in: 12 months with  Dr. Lovena Le in [place].   Any Other Special Instructions Will Be Listed Below (If Applicable).  If you need a refill on your cardiac medications before your next appointment, please call your pharmacy.

## 2020-12-21 NOTE — Progress Notes (Signed)
PCP: Asencion Noble, MD Primary Cardiologist: Dr Domenic Polite Primary EP: Dr Rayann Heman  Miguel Webb is a 80 y.o. male who presents today for routine electrophysiology followup.  Since last being seen in our clinic, the patient reports doing very well.  Today, he denies symptoms of palpitations, chest pain, shortness of breath,  lower extremity edema, dizziness, presyncope, or syncope.  The patient is otherwise without complaint today.   Past Medical History:  Diagnosis Date   Arthritis    Carotid artery disease (HCC)    CKD (chronic kidney disease) stage 3, GFR 30-59 ml/min (HCC)    Erectile dysfunction    Essential hypertension    Fecal impaction (HCC)    Hyperlipidemia    Proteinuria    Type 2 diabetes mellitus ([place])    Past Surgical History:  Procedure Laterality Date   CIRCUMCISION     COLONOSCOPY N/A 05/11/2015   Procedure: COLONOSCOPY;  Surgeon: [name], MD;  [facility];  Service: Endoscopy;  Laterality: N/A;  230    ENDARTERECTOMY Left 10/20/2015   Procedure: Left CAROTID Endartectomy;  Surgeon: [name], MD;  Location: [place];  Service: Vascular;  Laterality: Left;   PATCH ANGIOPLASTY Left 10/20/2015   Procedure: PATCH ANGIOPLASTY;  Surgeon: [name], MD;  Location: [facility] OR;  Service: Vascular;  Laterality: Left;   WISDOM TOOTH EXTRACTION      ROS- all systems are reviewed and negatives except as per HPI above  Current Outpatient Medications  Medication Sig Dispense Refill   aspirin 81 MG tablet Take 81 mg by mouth daily.     donepezil (ARICEPT) 10 MG tablet Take 10 mg by mouth daily.     ipratropium (ATROVENT) 0.03 % nasal spray Place 1 spray into both nostrils as needed for allergies.     lisinopril (PRINIVIL,ZESTRIL) 20 MG tablet Take 20 mg by mouth daily.     metoprolol succinate (TOPROL-XL) 25 MG 24 hr tablet TAKE 1/2 TABLET BY MOUTH EVERY DAY 15 tablet 0   polyethylene glycol (MIRALAX / GLYCOLAX) 17 g packet Take 17 g by  mouth daily.     simvastatin (ZOCOR) 20 MG tablet Take 20 mg by mouth daily.     polyethylene glycol-electrolytes (TRILYTE) 420 g solution Take 4,000 mLs by mouth as directed. (Patient not taking: Reported on 12/21/2020) 4000 mL 0   senna (SENOKOT) 8.6 MG tablet Take 1 tablet by mouth daily. (Patient not taking: Reported on 12/21/2020)     No current facility-administered medications for this visit.    Physical Exam: Vitals:   12/21/20 1205  BP: (!) 142/80  Pulse: 95  SpO2: 99%  Weight: 117 lb (53.1 kg)  Height: 5' 6.5" (1.689 m)    GEN- The patient is well appearing, alert and oriented x 3 today.   Head- normocephalic, atraumatic Eyes-  Sclera clear, conjunctiva pink Ears- hearing intact Oropharynx- clear Lungs- Clear to ausculation bilaterally, normal work of breathing Heart- Regular rate and rhythm, no murmurs, rubs or gallops, PMI not laterally displaced GI- soft, NT, ND, + BS Extremities- no clubbing, cyanosis, or edema  Wt Readings from Last 3 Encounters:  12/21/20 117 lb (53.1 kg)  07/02/20 118 lb (53.5 kg)  06/21/20 137 lb 2 oz (62.2 kg)    EKG tracing ordered today is personally reviewed and shows sinus rhythm, RBBB, LAD  Assessment and Plan:  PVCs, NSVT Outflow tract in origin Benign Asymptomatic No further workup is advised  2. HTN Stable No change  required today   Risks, benefits and potential toxicities for medications prescribed and/or refilled reviewed with patient today.   Return to see Dr Lovena Le in [place] in a year  Thompson Grayer MD, [facility] 12/21/2020 12:30 PM

## 2020-12-26 DIAGNOSIS — N1832 Chronic kidney disease, stage 3b: Secondary | ICD-10-CM | POA: Diagnosis not present

## 2020-12-26 DIAGNOSIS — M109 Gout, unspecified: Secondary | ICD-10-CM | POA: Diagnosis not present

## 2020-12-26 DIAGNOSIS — Z79899 Other long term (current) drug therapy: Secondary | ICD-10-CM | POA: Diagnosis not present

## 2020-12-26 DIAGNOSIS — I10 Essential (primary) hypertension: Secondary | ICD-10-CM | POA: Diagnosis not present

## 2020-12-26 DIAGNOSIS — E1129 Type 2 diabetes mellitus with other diabetic kidney complication: Secondary | ICD-10-CM | POA: Diagnosis not present

## 2021-01-02 DIAGNOSIS — R7309 Other abnormal glucose: Secondary | ICD-10-CM | POA: Diagnosis not present

## 2021-01-02 DIAGNOSIS — E1122 Type 2 diabetes mellitus with diabetic chronic kidney disease: Secondary | ICD-10-CM | POA: Diagnosis not present

## 2021-01-02 DIAGNOSIS — Z23 Encounter for immunization: Secondary | ICD-10-CM | POA: Diagnosis not present

## 2021-01-02 DIAGNOSIS — N1832 Chronic kidney disease, stage 3b: Secondary | ICD-10-CM | POA: Diagnosis not present

## 2021-01-24 ENCOUNTER — Other Ambulatory Visit: Payer: Self-pay | Admitting: Internal Medicine

## 2021-01-30 DIAGNOSIS — I10 Essential (primary) hypertension: Secondary | ICD-10-CM | POA: Diagnosis not present

## 2021-01-30 DIAGNOSIS — E119 Type 2 diabetes mellitus without complications: Secondary | ICD-10-CM | POA: Diagnosis not present

## 2021-01-30 DIAGNOSIS — E785 Hyperlipidemia, unspecified: Secondary | ICD-10-CM | POA: Diagnosis not present

## 2021-01-30 DIAGNOSIS — N189 Chronic kidney disease, unspecified: Secondary | ICD-10-CM | POA: Diagnosis not present

## 2021-02-07 DIAGNOSIS — R221 Localized swelling, mass and lump, neck: Secondary | ICD-10-CM | POA: Diagnosis not present

## 2021-04-13 DIAGNOSIS — I10 Essential (primary) hypertension: Secondary | ICD-10-CM | POA: Diagnosis not present

## 2021-04-13 DIAGNOSIS — M109 Gout, unspecified: Secondary | ICD-10-CM | POA: Diagnosis not present

## 2021-04-13 DIAGNOSIS — N1832 Chronic kidney disease, stage 3b: Secondary | ICD-10-CM | POA: Diagnosis not present

## 2021-04-13 DIAGNOSIS — G309 Alzheimer's disease, unspecified: Secondary | ICD-10-CM | POA: Diagnosis not present

## 2021-04-13 DIAGNOSIS — E1129 Type 2 diabetes mellitus with other diabetic kidney complication: Secondary | ICD-10-CM | POA: Diagnosis not present

## 2021-04-21 DIAGNOSIS — E1122 Type 2 diabetes mellitus with diabetic chronic kidney disease: Secondary | ICD-10-CM | POA: Diagnosis not present

## 2021-04-21 DIAGNOSIS — N1832 Chronic kidney disease, stage 3b: Secondary | ICD-10-CM | POA: Diagnosis not present

## 2021-04-21 DIAGNOSIS — I700 Atherosclerosis of aorta: Secondary | ICD-10-CM | POA: Diagnosis not present

## 2021-04-21 DIAGNOSIS — R7309 Other abnormal glucose: Secondary | ICD-10-CM | POA: Diagnosis not present

## 2021-04-21 DIAGNOSIS — E785 Hyperlipidemia, unspecified: Secondary | ICD-10-CM | POA: Diagnosis not present

## 2021-04-21 DIAGNOSIS — I10 Essential (primary) hypertension: Secondary | ICD-10-CM | POA: Diagnosis not present

## 2021-06-06 DIAGNOSIS — H25813 Combined forms of age-related cataract, bilateral: Secondary | ICD-10-CM | POA: Diagnosis not present

## 2021-07-21 DIAGNOSIS — M109 Gout, unspecified: Secondary | ICD-10-CM | POA: Diagnosis not present

## 2021-07-21 DIAGNOSIS — N1832 Chronic kidney disease, stage 3b: Secondary | ICD-10-CM | POA: Diagnosis not present

## 2021-07-21 DIAGNOSIS — I10 Essential (primary) hypertension: Secondary | ICD-10-CM | POA: Diagnosis not present

## 2021-07-21 DIAGNOSIS — E785 Hyperlipidemia, unspecified: Secondary | ICD-10-CM | POA: Diagnosis not present

## 2021-07-21 DIAGNOSIS — E1129 Type 2 diabetes mellitus with other diabetic kidney complication: Secondary | ICD-10-CM | POA: Diagnosis not present

## 2021-07-24 DIAGNOSIS — N1832 Chronic kidney disease, stage 3b: Secondary | ICD-10-CM | POA: Diagnosis not present

## 2021-07-24 DIAGNOSIS — G309 Alzheimer's disease, unspecified: Secondary | ICD-10-CM | POA: Diagnosis not present

## 2021-07-24 DIAGNOSIS — E785 Hyperlipidemia, unspecified: Secondary | ICD-10-CM | POA: Diagnosis not present

## 2021-07-24 DIAGNOSIS — I10 Essential (primary) hypertension: Secondary | ICD-10-CM | POA: Diagnosis not present

## 2021-07-24 DIAGNOSIS — E1122 Type 2 diabetes mellitus with diabetic chronic kidney disease: Secondary | ICD-10-CM | POA: Diagnosis not present

## 2021-07-24 DIAGNOSIS — R7309 Other abnormal glucose: Secondary | ICD-10-CM | POA: Diagnosis not present

## 2021-09-18 DIAGNOSIS — H01001 Unspecified blepharitis right upper eyelid: Secondary | ICD-10-CM | POA: Diagnosis not present

## 2021-09-18 DIAGNOSIS — H11823 Conjunctivochalasis, bilateral: Secondary | ICD-10-CM | POA: Diagnosis not present

## 2021-09-18 DIAGNOSIS — H25813 Combined forms of age-related cataract, bilateral: Secondary | ICD-10-CM | POA: Diagnosis not present

## 2021-09-18 DIAGNOSIS — H43811 Vitreous degeneration, right eye: Secondary | ICD-10-CM | POA: Diagnosis not present

## 2021-10-20 DIAGNOSIS — N1832 Chronic kidney disease, stage 3b: Secondary | ICD-10-CM | POA: Diagnosis not present

## 2021-10-20 DIAGNOSIS — M109 Gout, unspecified: Secondary | ICD-10-CM | POA: Diagnosis not present

## 2021-10-20 DIAGNOSIS — I10 Essential (primary) hypertension: Secondary | ICD-10-CM | POA: Diagnosis not present

## 2021-10-20 DIAGNOSIS — E1129 Type 2 diabetes mellitus with other diabetic kidney complication: Secondary | ICD-10-CM | POA: Diagnosis not present

## 2021-10-20 DIAGNOSIS — G309 Alzheimer's disease, unspecified: Secondary | ICD-10-CM | POA: Diagnosis not present

## 2021-10-30 DIAGNOSIS — I10 Essential (primary) hypertension: Secondary | ICD-10-CM | POA: Diagnosis not present

## 2021-10-30 DIAGNOSIS — N1832 Chronic kidney disease, stage 3b: Secondary | ICD-10-CM | POA: Diagnosis not present

## 2021-10-30 DIAGNOSIS — G309 Alzheimer's disease, unspecified: Secondary | ICD-10-CM | POA: Diagnosis not present

## 2021-11-21 ENCOUNTER — Other Ambulatory Visit: Payer: Self-pay | Admitting: *Deleted

## 2021-11-21 NOTE — Patient Outreach (Signed)
  Care Coordination   11/21/2021 Name: Miguel Webb MRN: [id number] DOB: 1940-04-11   Care Coordination Outreach Attempts:  An unsuccessful telephone outreach was attempted today to offer the patient information about available care coordination services as a benefit of their health plan.   Follow Up Plan:  Additional outreach attempts will be made to offer the patient care coordination information and services.   Encounter Outcome:  No Answer  Care Coordination Interventions Activated:  No   Care Coordination Interventions:  No, not indicated    Valente David, RN, MSN, [facility] Care Coordinator [phone]

## 2021-12-16 ENCOUNTER — Other Ambulatory Visit: Payer: Self-pay | Admitting: Internal Medicine

## 2022-01-09 DIAGNOSIS — Z23 Encounter for immunization: Secondary | ICD-10-CM | POA: Diagnosis not present

## 2022-01-10 ENCOUNTER — Telehealth: Payer: Self-pay | Admitting: *Deleted

## 2022-01-10 NOTE — Patient Outreach (Signed)
  Care Coordination   01/10/2022 Name: Miguel Webb MRN: [id number] DOB: 1940/12/06   Care Coordination Outreach Attempts:  A second unsuccessful outreach was attempted today to offer the patient with information about available care coordination services as a benefit of their health plan.     Follow Up Plan:  Additional outreach attempts will be made to offer the patient care coordination information and services.   Encounter Outcome:  No Answer  Care Coordination Interventions Activated:  No   Care Coordination Interventions:  No, not indicated    Valente David, RN, MSN, [facility] Care Management Care Management Coordinator [phone]

## 2022-01-15 ENCOUNTER — Telehealth: Payer: Self-pay | Admitting: *Deleted

## 2022-01-15 NOTE — Patient Outreach (Addendum)
  Care Coordination   Initial Visit Note   01/15/2022 Name: Miguel Webb MRN: [id number] DOB: 1940-11-23  Miguel Webb is a 81 y.o. year old male who sees Asencion Noble, MD for primary care. I spoke with wife of Miguel Webb by phone today.  What matters to the patients health and wellness today?  Wife does not have time to talk today, request call back.  Member has dementia.     SDOH assessments and interventions completed:  No     Care Coordination Interventions Activated:  No  Care Coordination Interventions:  No, not indicated   Follow up plan: Follow up call scheduled for 10/25    Encounter Outcome:  Pt. Request to Call [place], RN, MSN, [place] Management Care Management Coordinator [phone]

## 2022-01-24 ENCOUNTER — Ambulatory Visit: Payer: Self-pay | Admitting: *Deleted

## 2022-01-24 NOTE — Patient Outreach (Signed)
  Care Coordination   Initial Visit Note   01/24/2022 Name: ABEDNEGO YEATES MRN: [id number] DOB: [birth year]  HAILE BOSLER is a 81 y.o. year old male who sees Asencion Noble, MD for primary care. I spoke with Inez Catalina, wife of  RICHARDSON DUBREE by phone today.  What matters to the patients health and wellness today?  State member remains independent with some ADLs despite having Alzheimer's.  Has support of son and daughters, does not wish to have follow up calls at this time.    Goals Addressed             This Visit's Progress    COMPLETED: Care Coordination Activities - No follow up needed       Care Coordination Interventions: Evaluation of current treatment plan related to alzheimers and patient's adherence to plan as established by provider Reviewed medications with patient and discussed affordability Reviewed scheduled/upcoming provider appointments including need for yearly AWV Discussed plans with patient for ongoing care management follow up and provided patient with direct contact information for care management team Assessed social determinant of health barriers         SDOH assessments and interventions completed:  Yes  SDOH Interventions Today    Flowsheet Row Most Recent Value  SDOH Interventions   Food Insecurity Interventions Intervention Not Indicated  Housing Interventions Intervention Not Indicated  Transportation Interventions Intervention Not Indicated  Utilities Interventions Intervention Not Indicated        Care Coordination Interventions Activated:  Yes  Care Coordination Interventions:  Yes, provided   Follow up plan: No further intervention required.   Encounter Outcome:  Pt. Visit Completed   Valente David, RN, MSN, [place] Care Management Care Management Coordinator [phone]

## 2022-02-19 DIAGNOSIS — I10 Essential (primary) hypertension: Secondary | ICD-10-CM | POA: Diagnosis not present

## 2022-02-19 DIAGNOSIS — Z79899 Other long term (current) drug therapy: Secondary | ICD-10-CM | POA: Diagnosis not present

## 2022-02-19 DIAGNOSIS — N1832 Chronic kidney disease, stage 3b: Secondary | ICD-10-CM | POA: Diagnosis not present

## 2022-02-19 DIAGNOSIS — G301 Alzheimer's disease with late onset: Secondary | ICD-10-CM | POA: Diagnosis not present

## 2022-03-22 DIAGNOSIS — E1122 Type 2 diabetes mellitus with diabetic chronic kidney disease: Secondary | ICD-10-CM | POA: Diagnosis not present

## 2022-03-22 DIAGNOSIS — N1832 Chronic kidney disease, stage 3b: Secondary | ICD-10-CM | POA: Diagnosis not present

## 2022-03-22 DIAGNOSIS — M109 Gout, unspecified: Secondary | ICD-10-CM | POA: Diagnosis not present

## 2022-03-22 DIAGNOSIS — G309 Alzheimer's disease, unspecified: Secondary | ICD-10-CM | POA: Diagnosis not present

## 2022-03-22 DIAGNOSIS — R000 Tachycardia, unspecified: Secondary | ICD-10-CM | POA: Diagnosis not present

## 2022-03-22 DIAGNOSIS — I480 Paroxysmal atrial fibrillation: Secondary | ICD-10-CM | POA: Diagnosis not present

## 2022-04-03 IMAGING — DX DG ABDOMEN 1V
2 series · 2 of 2 positions shown · non-contrast
Comparison: None.

CLINICAL DATA: Abdominal pain and constipation.

EXAM:
ABDOMEN - 1 VIEW

[abdomen supine (1 of 2)]
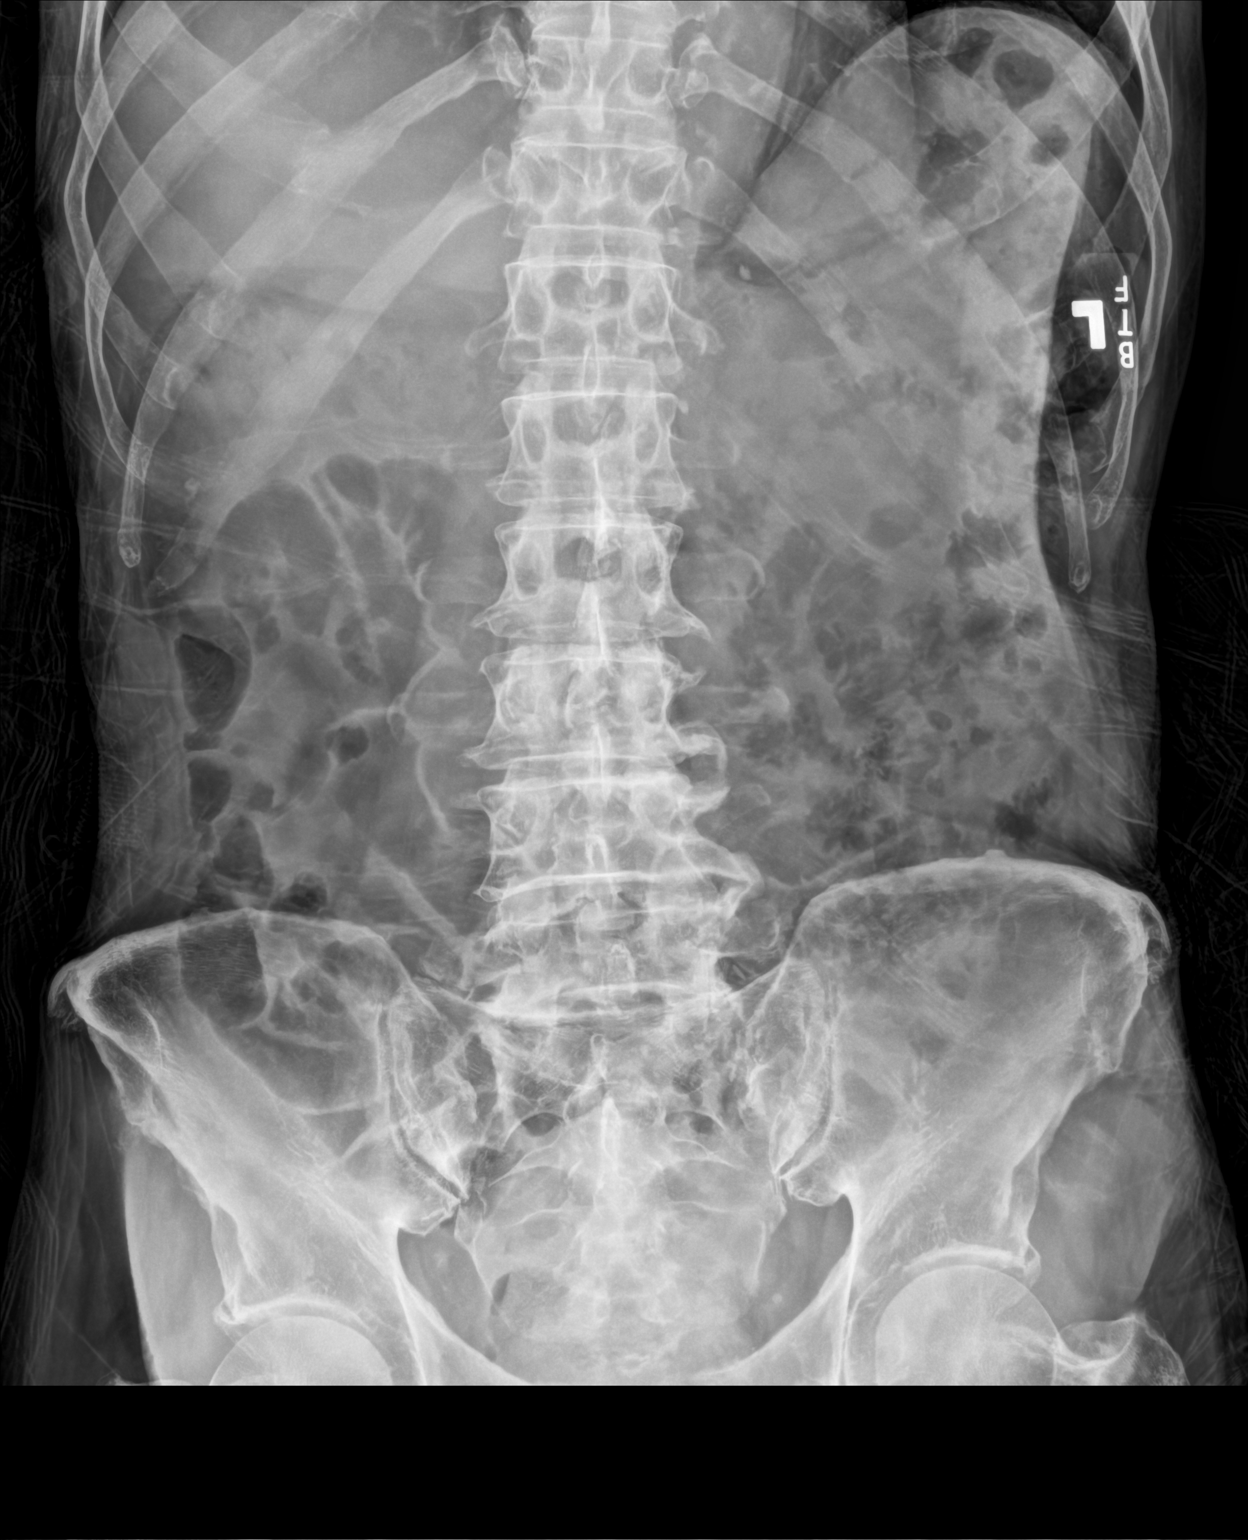

[abdomen supine (2 of 2)]
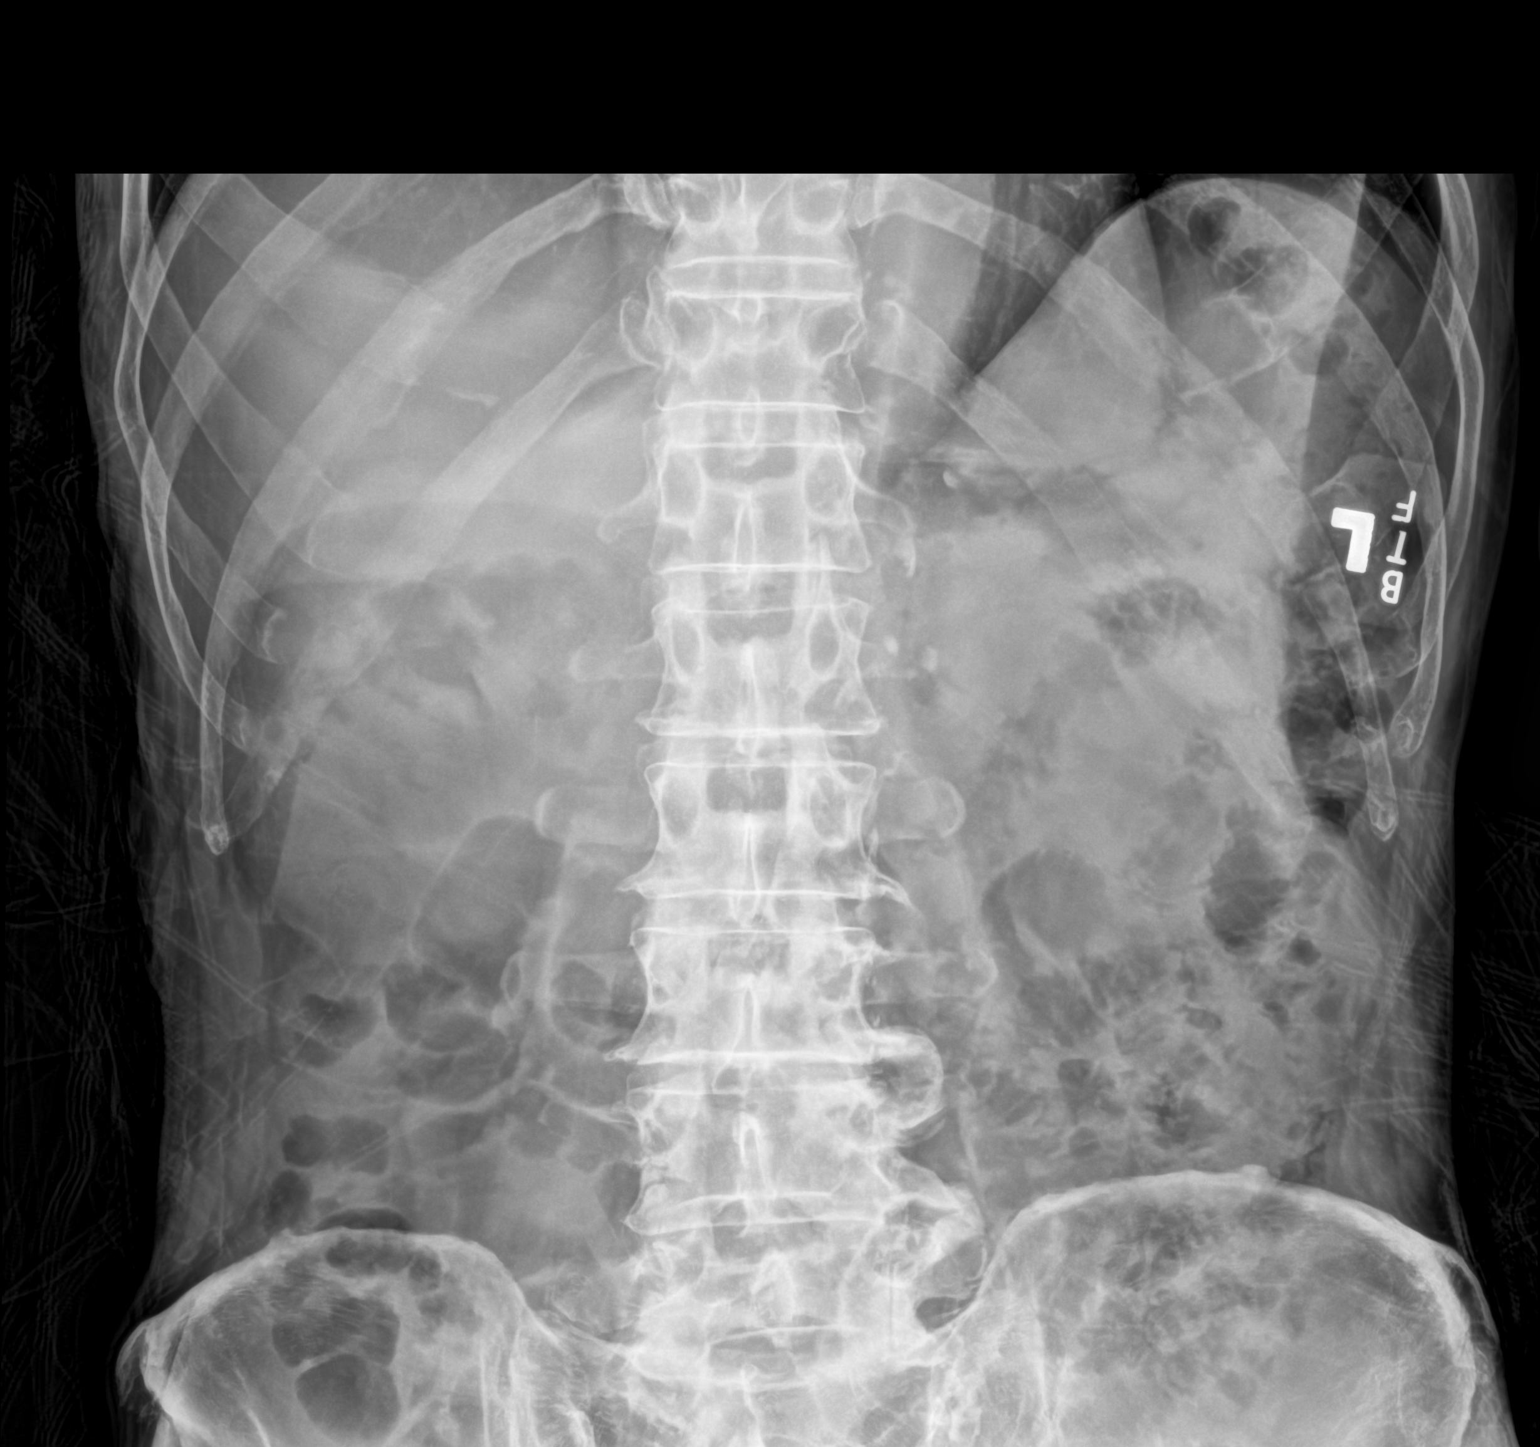

[2 of 2 positions shown; findings below may reference images not displayed]

FINDINGS: No evidence of dilated bowel loops. No significant stool burden
noted. No radio-opaque calculi or other significant radiographic
abnormality are seen.
IMPRESSION: Negative.

## 2022-05-14 ENCOUNTER — Other Ambulatory Visit: Payer: Self-pay | Admitting: *Deleted

## 2022-05-14 MED ORDER — METOPROLOL SUCCINATE ER 25 MG PO TB24: 12.5000 mg | ORAL_TABLET | Freq: Every day | ORAL | 0 refills | Status: DC

## 2022-06-19 DIAGNOSIS — R000 Tachycardia, unspecified: Secondary | ICD-10-CM | POA: Diagnosis not present

## 2022-06-19 DIAGNOSIS — E1129 Type 2 diabetes mellitus with other diabetic kidney complication: Secondary | ICD-10-CM | POA: Diagnosis not present

## 2022-06-19 DIAGNOSIS — N1832 Chronic kidney disease, stage 3b: Secondary | ICD-10-CM | POA: Diagnosis not present

## 2022-06-19 DIAGNOSIS — M1000 Idiopathic gout, unspecified site: Secondary | ICD-10-CM | POA: Diagnosis not present

## 2022-06-19 DIAGNOSIS — G309 Alzheimer's disease, unspecified: Secondary | ICD-10-CM | POA: Diagnosis not present

## 2022-06-21 ENCOUNTER — Telehealth: Payer: Self-pay | Admitting: Internal Medicine

## 2022-06-21 ENCOUNTER — Encounter: Payer: Self-pay | Admitting: Cardiovascular Disease

## 2022-06-21 MED ORDER — METOPROLOL SUCCINATE ER 25 MG PO TB24: 12.5000 mg | ORAL_TABLET | Freq: Every day | ORAL | 0 refills | Status: DC

## 2022-06-21 NOTE — Telephone Encounter (Signed)
Refill complete 

## 2022-06-21 NOTE — Telephone Encounter (Signed)
error 

## 2022-06-21 NOTE — Telephone Encounter (Signed)
*  STAT* If patient is at the pharmacy, call can be transferred to refill team.   1. Which medications need to be refilled? (please list name of each medication and dose if known) metoprolol succinate (TOPROL-XL) 25 MG 24 hr tablet   2. Which pharmacy/location (including street and city if local pharmacy) is medication to be sent to? [id number] - [place], [place] - [place]   3. Do they need a 30 day or 90 day supply? 30     Previous [facility] pt, pt is scheduled to see Dr. Lovena Le in [place] 07/24/22

## 2022-06-29 ENCOUNTER — Emergency Department (HOSPITAL_COMMUNITY): Payer: Medicare Other

## 2022-06-29 ENCOUNTER — Encounter (HOSPITAL_COMMUNITY): Payer: Self-pay

## 2022-06-29 ENCOUNTER — Other Ambulatory Visit: Payer: Self-pay

## 2022-06-29 ENCOUNTER — Emergency Department (HOSPITAL_COMMUNITY)
Admission: EM | Admit: 2022-06-29 | Discharge: 2022-06-30 | Disposition: A | Discharge status: Home / Self Care | Payer: Medicare Other | Attending: Emergency Medicine | Admitting: Emergency Medicine

## 2022-06-29 DIAGNOSIS — Z7982 Long term (current) use of aspirin: Secondary | ICD-10-CM | POA: Diagnosis not present

## 2022-06-29 DIAGNOSIS — K5900 Constipation, unspecified: Secondary | ICD-10-CM | POA: Insufficient documentation

## 2022-06-29 DIAGNOSIS — K7689 Other specified diseases of liver: Secondary | ICD-10-CM | POA: Diagnosis not present

## 2022-06-29 DIAGNOSIS — N281 Cyst of kidney, acquired: Secondary | ICD-10-CM | POA: Diagnosis not present

## 2022-06-29 DIAGNOSIS — R1084 Generalized abdominal pain: Secondary | ICD-10-CM | POA: Diagnosis present

## 2022-06-29 LAB — COMPREHENSIVE METABOLIC PANEL
ALT: 18 U/L (ref 0–44)
AST: 23 U/L (ref 15–41)
Albumin: 3.9 g/dL (ref 3.5–5.0)
Alkaline Phosphatase: 59 U/L (ref 38–126)
Anion gap: 8 (ref 5–15)
BUN: 26 mg/dL — ABNORMAL HIGH (ref 8–23)
CO2: 25 mmol/L (ref 22–32)
Calcium: 8.9 mg/dL (ref 8.9–10.3)
Chloride: 104 mmol/L (ref 98–111)
Creatinine, Ser: 1.97 mg/dL — ABNORMAL HIGH (ref 0.61–1.24)
GFR, Estimated: 33 mL/min — ABNORMAL LOW (ref >60)
Glucose, Bld: 125 mg/dL — ABNORMAL HIGH (ref 70–99)
Potassium: 4.3 mmol/L (ref 3.5–5.1)
Sodium: 137 mmol/L (ref 135–145)
Total Bilirubin: 0.3 mg/dL (ref 0.3–1.2)
Total Protein: 7.2 g/dL (ref 6.5–8.1)

## 2022-06-29 LAB — CBC WITH DIFFERENTIAL/PLATELET
Abs Immature Granulocytes: 0.02 10*3/uL (ref 0.00–0.07)
Basophils Absolute: 0 10*3/uL (ref 0.0–0.1)
Basophils Relative: 0 %
Eosinophils Absolute: 0 10*3/uL (ref 0.0–0.5)
Eosinophils Relative: 0 %
HCT: 44.1 % (ref 39.0–52.0)
Hemoglobin: 13.8 g/dL (ref 13.0–17.0)
Immature Granulocytes: 0 %
Lymphocytes Relative: 20 %
Lymphs Abs: 1.3 10*3/uL (ref 0.7–4.0)
MCH: 29.1 pg (ref 26.0–34.0)
MCHC: 31.3 g/dL (ref 30.0–36.0)
MCV: 92.8 fL (ref 80.0–100.0)
Monocytes Absolute: 0.6 10*3/uL (ref 0.1–1.0)
Monocytes Relative: 9 %
Neutro Abs: 4.7 10*3/uL (ref 1.7–7.7)
Neutrophils Relative %: 71 %
Platelets: 197 10*3/uL (ref 150–400)
RBC: 4.75 MIL/uL (ref 4.22–5.81)
RDW: 15 % (ref 11.5–15.5)
WBC: 6.7 10*3/uL (ref 4.0–10.5)
nRBC: 0 % (ref 0.0–0.2)

## 2022-06-29 LAB — URINALYSIS, ROUTINE W REFLEX MICROSCOPIC
Bilirubin Urine: NEGATIVE
Glucose, UA: NEGATIVE mg/dL
Hgb urine dipstick: NEGATIVE
Ketones, ur: NEGATIVE mg/dL
Leukocytes,Ua: NEGATIVE
Nitrite: NEGATIVE
Protein, ur: NEGATIVE mg/dL
Specific Gravity, Urine: 1.042 — ABNORMAL HIGH (ref 1.005–1.030)
pH: 6 (ref 5.0–8.0)

## 2022-06-29 LAB — LIPASE, BLOOD: Lipase: 69 U/L — ABNORMAL HIGH (ref 11–51)

## 2022-06-29 MED ORDER — IOHEXOL 300 MG/ML  SOLN
80.0000 mL | Freq: Once | INTRAMUSCULAR | Status: AC | PRN
  Administered 2022-06-29: 80 mL via INTRAVENOUS

## 2022-06-29 MED ORDER — METAMUCIL SMOOTH TEXTURE 58.6 % PO POWD: 1.0000 | Freq: Every day | ORAL | 12 refills | Status: AC

## 2022-06-29 NOTE — ED Provider Notes (Signed)
[facility] Provider Note   CSN: [id number] Arrival date & time: 06/29/22  1855     History {Add pertinent medical, surgical, social history, OB history to HPI:1} Chief Complaint  Patient presents with   Abdominal Pain    Miguel Webb is a 82 y.o. male.  Patient complains of lower abdominal pain.  Family thinks he is constipated.  Patient has a history of dementia   Abdominal Pain      Home Medications Prior to Admission medications   Medication Sig Start Date End Date Taking? Authorizing Provider  aspirin 81 MG tablet Take 81 mg by mouth daily.    [provider]  donepezil (ARICEPT) 10 MG tablet Take 10 mg by mouth daily. 11/09/20   [provider]  ipratropium (ATROVENT) 0.03 % nasal spray Place 1 spray into both nostrils as needed for allergies. 11/04/20   [provider]  lisinopril (PRINIVIL,ZESTRIL) 20 MG tablet Take 20 mg by mouth daily.    [provider]  metoprolol succinate (TOPROL-XL) 25 MG 24 hr tablet Take 0.5 tablets (12.5 mg total) by mouth daily. 06/21/22   Satira Sark, MD  polyethylene glycol (MIRALAX / GLYCOLAX) 17 g packet Take 17 g by mouth daily.    [provider]  polyethylene glycol-electrolytes (TRILYTE) 420 g solution Take 4,000 mLs by mouth as directed. Patient not taking: Reported on 12/21/2020 04/26/15   [name], MD  senna (SENOKOT) 8.6 MG tablet Take 1 tablet by mouth daily. Patient not taking: Reported on 12/21/2020    [provider]  simvastatin (ZOCOR) 20 MG tablet Take 20 mg by mouth daily.    [provider]      Allergies    Patient has no known allergies.    Review of Systems   Review of Systems  Gastrointestinal:  Positive for abdominal pain.    Physical Exam Updated Vital Signs BP (!) 129/91   Pulse 82   Temp 98.3 F (36.8 C)   Resp 17   Ht 6' (1.829 m)   Wt 53.5 kg   SpO2 99%   BMI 16.00 kg/m   Physical Exam  ED Results / Procedures / Treatments   Labs (all labs ordered are listed, but only abnormal results are displayed) Labs Reviewed  COMPREHENSIVE METABOLIC PANEL - Abnormal; Notable for the following components:      Result Value   Glucose, Bld 125 (*)    BUN 26 (*)    Creatinine, Ser 1.97 (*)    GFR, Estimated 33 (*)    All other components within normal limits  LIPASE, BLOOD - Abnormal; Notable for the following components:   Lipase 69 (*)    All other components within normal limits  URINALYSIS, ROUTINE W REFLEX MICROSCOPIC - Abnormal; Notable for the following components:   Specific Gravity, Urine 1.042 (*)    All other components within normal limits  CBC WITH DIFFERENTIAL/PLATELET    EKG None  Radiology CT Abdomen Pelvis W Contrast  Result Date: 06/29/2022 CLINICAL DATA:  Bowel obstruction suspected EXAM: CT ABDOMEN AND PELVIS WITH CONTRAST TECHNIQUE: Multidetector CT imaging of the abdomen and pelvis was performed using the standard protocol following bolus administration of intravenous contrast. RADIATION DOSE REDUCTION: This exam was performed according to the departmental dose-optimization program which includes automated exposure control, adjustment of the mA and/or kV according to patient size and/or use of iterative reconstruction technique. CONTRAST:  51mL OMNIPAQUE IOHEXOL 300 MG/ML  SOLN COMPARISON:  07/02/2020 FINDINGS: Lower chest: No acute abnormality. Diffuse coronary artery and aortic calcifications. Hepatobiliary: Few scattered cysts within the liver. Gallbladder unremarkable. No biliary ductal dilatation. Pancreas: No focal abnormality or ductal dilatation. Spleen: No focal abnormality.  Normal size. Adrenals/Urinary Tract: Numerous bilateral renal cysts are stable since prior study. No follow-up imaging recommended. No renal or ureteral stones. No hydronephrosis. Adrenal glands and urinary bladder unremarkable. Stomach/Bowel: Large stool burden  throughout the colon, most severe in the rectum concerning for fecal impaction. Normal appendix. Stomach and small bowel decompressed. No bowel obstruction. Vascular/Lymphatic: Heavily calcified aorta and iliac vessels. No evidence of aneurysm or adenopathy. Reproductive: Markedly enlarged prostate with a transverse diameter of 7 cm. Other: No free fluid or free air. Musculoskeletal: No acute bony abnormality. IMPRESSION: Large stool burden throughout the colon, most severe in the rectum concerning for fecal impaction. Diffuse aortoiliac atherosclerosis. Prostate enlargement. Electronically Signed   By: Rolm Baptise M.D.   On: 06/29/2022 22:21    Procedures Procedures  {Document cardiac monitor, telemetry assessment procedure when appropriate:1}  Medications Ordered in ED Medications  iohexol (OMNIPAQUE) 300 MG/ML solution 80 mL (80 mLs Intravenous Contrast Given 06/29/22 2203)    ED Course/ Medical Decision Making/ A&P   {   Click here for ABCD2, HEART and other calculatorsREFRESH Note before signing :1}                          Medical Decision Making  Patient with constipation.  He was given tapwater enema  {Document critical care time when appropriate:1} {Document review of labs and clinical decision tools ie heart score, Chads2Vasc2 etc:1}  {Document your independent review of radiology images, and any outside records:1} {Document your discussion with family members, caretakers, and with consultants:1} {Document social determinants of health affecting pt's care:1} {Document your decision making why or why not admission, treatments were needed:1} Final Clinical Impression(s) / ED Diagnoses Final diagnoses:  None    Rx / DC Orders ED Discharge Orders     None

## 2022-06-29 NOTE — ED Triage Notes (Signed)
Abd pain    pt has dementia.  Family thinks he is constipated.  States he is constantly going to [facility] to feel like he needs a BM

## 2022-06-29 NOTE — Discharge Instructions (Signed)
Drink plenty of fluids and follow-up with your doctor next week.  Start taking the Metamucil

## 2022-06-29 NOTE — ED Notes (Signed)
Assisted patient to the bathroom for urine sample. Unsuccessful attempt at bowel movement.

## 2022-06-30 NOTE — ED Notes (Signed)
Patient was able to pass stool while sitting on the bedside commode. Patient is now dressed and ready for discharge.

## 2022-07-04 ENCOUNTER — Telehealth: Payer: Self-pay

## 2022-07-04 NOTE — Telephone Encounter (Signed)
        Patient  visited [facility] on 06/30/2022  for Abdominal Pain.   Telephone encounter attempt :  1st  Patient asleep will call back later.   [place] Resource Care Guide   ??[email or web address]  ?? [id number]   Website: [email or web address]  [email or web address]

## 2022-07-05 ENCOUNTER — Telehealth: Payer: Self-pay

## 2022-07-05 DIAGNOSIS — M109 Gout, unspecified: Secondary | ICD-10-CM | POA: Diagnosis not present

## 2022-07-05 DIAGNOSIS — E785 Hyperlipidemia, unspecified: Secondary | ICD-10-CM | POA: Diagnosis not present

## 2022-07-05 DIAGNOSIS — G309 Alzheimer's disease, unspecified: Secondary | ICD-10-CM | POA: Diagnosis not present

## 2022-07-05 DIAGNOSIS — N1832 Chronic kidney disease, stage 3b: Secondary | ICD-10-CM | POA: Diagnosis not present

## 2022-07-05 DIAGNOSIS — E1122 Type 2 diabetes mellitus with diabetic chronic kidney disease: Secondary | ICD-10-CM | POA: Diagnosis not present

## 2022-07-05 NOTE — Telephone Encounter (Signed)
        Patient  visited [facility] on 06/30/2022  for Abdominal Pain.   Telephone encounter attempt :  2nd  No answer/busy unable to leave message.   [place] Resource Care Guide   ??[email or web address]  ?? [phone]   Website: [email or web address]  [email or web address]

## 2022-07-16 ENCOUNTER — Other Ambulatory Visit: Payer: Self-pay | Admitting: Cardiology

## 2022-07-24 ENCOUNTER — Encounter: Payer: Self-pay | Admitting: Internal Medicine

## 2022-07-24 ENCOUNTER — Ambulatory Visit: Payer: Medicare Other | Attending: Internal Medicine | Admitting: Internal Medicine

## 2022-07-24 VITALS — BP 110/70 | HR 80 | Ht 69.0 in | Wt 127.0 lb

## 2022-07-24 DIAGNOSIS — I493 Ventricular premature depolarization: Secondary | ICD-10-CM

## 2022-07-24 MED ORDER — METOPROLOL SUCCINATE ER 25 MG PO TB24: 12.5000 mg | ORAL_TABLET | Freq: Every day | ORAL | 3 refills | Status: DC

## 2022-07-24 NOTE — Patient Instructions (Addendum)
Medication Instructions:  Your physician has recommended you make the following change in your medication:   Stop Taking Lisinopril  Your provider would like you to gain 5 pounds.    *If you need a refill on your cardiac medications before your next appointment, please call your pharmacy*   Lab Work: NONE   If you have labs (blood work) drawn today and your tests are completely normal, you will receive your results only by: MyChart Message (if you have MyChart) OR A paper copy in the mail If you have any lab test that is abnormal or we need to change your treatment, we will call you to review the results.   Testing/Procedures: NONE    Follow-Up: At [facility], you and your health needs are our priority.  As part of our continuing mission to provide you with exceptional heart care, we have created designated Provider Care Teams.  These Care Teams include your primary Cardiologist (physician) and Advanced Practice Providers (APPs -  Physician Assistants and Nurse Practitioners) who all work together to provide you with the care you need, when you need it.  We recommend signing up for the patient portal called "MyChart".  Sign up information is provided on this After Visit Summary.  MyChart is used to connect with patients for Virtual Visits (Telemedicine).  Patients are able to view lab/test results, encounter notes, upcoming appointments, etc.  Non-urgent messages can be sent to your provider as well.   To learn more about what you can do with MyChart, go to [email or web address].    Your next appointment:   1 year(s)  Provider:   Lewayne Bunting, MD    Other Instructions Thank you for choosing [facility]!

## 2022-07-24 NOTE — Progress Notes (Signed)
HPI Mr. Ferrari returns today for followup. He is a pleasant 82 yo man with  h/o chronic renal insufficiency, HTN, and DM. He had been followed by Dr. Fawn Kirk. For PVC's (7% burden 5 years ago) and in the interim he notes no symptoms. He has some lower blood pressures. No swelling.  No Known Allergies   Current Outpatient Medications  Medication Sig Dispense Refill   allopurinol (ZYLOPRIM) 100 MG tablet Take 200 mg by mouth daily.     aspirin 81 MG tablet Take 81 mg by mouth daily.     donepezil (ARICEPT) 10 MG tablet Take 10 mg by mouth daily.     ipratropium (ATROVENT) 0.03 % nasal spray Place 1 spray into both nostrils as needed for allergies.     lisinopril (ZESTRIL) 10 MG tablet Take 10 mg by mouth daily.     metoprolol succinate (TOPROL-XL) 25 MG 24 hr tablet TAKE 1/2 TABLET BY MOUTH EVERY DAY 15 tablet 0   polyethylene glycol (MIRALAX / GLYCOLAX) 17 g packet Take 17 g by mouth daily.     psyllium (METAMUCIL SMOOTH TEXTURE) 58.6 % powder Take 1 packet by mouth daily. 283 g 12   simvastatin (ZOCOR) 20 MG tablet Take 20 mg by mouth daily.     traZODone (DESYREL) 50 MG tablet Take 25-50 mg by mouth at bedtime as needed.     polyethylene glycol-electrolytes (TRILYTE) 420 g solution Take 4,000 mLs by mouth as directed. (Patient not taking: Reported on 12/21/2020) 4000 mL 0   senna (SENOKOT) 8.6 MG tablet Take 1 tablet by mouth daily. (Patient not taking: Reported on 12/21/2020)     No current facility-administered medications for this visit.     Past Medical History:  Diagnosis Date   Arthritis    Carotid artery disease    CKD (chronic kidney disease) stage 3, GFR 30-59 ml/min    Erectile dysfunction    Essential hypertension    Fecal impaction    Hyperlipidemia    Proteinuria    Type 2 diabetes mellitus     ROS:   All systems reviewed and negative except as noted in the HPI.   Past Surgical History:  Procedure Laterality Date   CIRCUMCISION     COLONOSCOPY N/A  05/11/2015   Procedure: COLONOSCOPY;  Surgeon: Corbin Ade, MD;  Location: [facility];  Service: Endoscopy;  Laterality: N/A;  230    ENDARTERECTOMY Left 10/20/2015   Procedure: Left CAROTID Endartectomy;  Surgeon: Chuck Hint, MD;  Location: [facility] OR;  Service: Vascular;  Laterality: Left;   PATCH ANGIOPLASTY Left 10/20/2015   Procedure: PATCH ANGIOPLASTY;  Surgeon: Chuck Hint, MD;  Location: [facility] OR;  Service: Vascular;  Laterality: Left;   WISDOM TOOTH EXTRACTION       Family History  Problem Relation Age of Onset   Peptic Ulcer Disease Mother    Stroke Father    Hypertension Father    Breast cancer Sister    Hypertension Sister    Prostate cancer Brother    Hypertension Brother      Social History   Socioeconomic History   Marital status: Married    Spouse name: Not on file   Number of children: Not on file   Years of education: Not on file   Highest education level: Not on file  Occupational History   Not on file  Tobacco Use   Smoking status: Former    Types: Cigarettes, Pipe   Smokeless  tobacco: Former    Types: Chew   Tobacco comments:    Quit smoking cigarettes and Pipe 28-30 years ago  Building services engineer Use: Never used  Substance and Sexual Activity   Alcohol use: Not Currently    Comment: 1-2 times per week drinks a little vodka   Drug use: No   Sexual activity: Not on file  Other Topics Concern   Not on file  Social History Narrative   Lives in [place] with wife and son   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   Social Determinants of Health   Financial Resource Strain: Not on file  Food Insecurity: No Food Insecurity (01/24/2022)   Hunger Vital Sign    Worried About Running Out of Food in the Last Year: Never true    Ran Out of Food in the Last Year: Never true  Transportation Needs: No Transportation Needs (01/24/2022)   PRAPARE - Scientist, research (physical sciences) (Medical): No    Lack of Transportation (Non-Medical): No  Physical Activity: Not on file  Stress: Not on file  Social Connections: Not on file  Intimate Partner Violence: Not on file     BP 110/70   Pulse 80   Ht  (1.753 m)   Wt 127 lb (57.6 kg)   SpO2 97%   BMI 18.75 kg/m   Physical Exam:  Well appearing NAD HEENT: Unremarkable Neck:  No JVD, no thyromegally Lymphatics:  No adenopathy Back:  No CVA tenderness Lungs:  Clear with no wheezes HEART:  Regular rate rhythm, no murmurs, no rubs, no clicks Abd:  soft, positive bowel sounds, no organomegally, no rebound, no guarding Ext:  2 plus pulses, no edema, no cyanosis, no clubbing Skin:  No rashes no nodules Neuro:  CN II through XII intact, motor grossly intact  DEVICE  Normal device function.  See PaceArt for details.   Assess/Plan: PVC's - his symptoms are well controlled. He will stop his lisinopril as his bp a little low. HTN - his bp is now running low. I asked him to stop his lisinopril and continue toprol.  Sharlot Gowda Avelardo Reesman,MD

## 2022-09-24 ENCOUNTER — Emergency Department (HOSPITAL_COMMUNITY): Payer: Medicare Other

## 2022-09-24 ENCOUNTER — Other Ambulatory Visit: Payer: Self-pay

## 2022-09-24 ENCOUNTER — Emergency Department (HOSPITAL_COMMUNITY)
Admission: EM | Admit: 2022-09-24 | Discharge: 2022-09-24 | Disposition: A | Discharge status: Home / Self Care | Payer: Medicare Other | Attending: Student | Admitting: Student

## 2022-09-24 ENCOUNTER — Encounter (HOSPITAL_COMMUNITY): Payer: Self-pay | Admitting: Emergency Medicine

## 2022-09-24 DIAGNOSIS — I129 Hypertensive chronic kidney disease with stage 1 through stage 4 chronic kidney disease, or unspecified chronic kidney disease: Secondary | ICD-10-CM | POA: Insufficient documentation

## 2022-09-24 DIAGNOSIS — I2510 Atherosclerotic heart disease of native coronary artery without angina pectoris: Secondary | ICD-10-CM | POA: Insufficient documentation

## 2022-09-24 DIAGNOSIS — K5900 Constipation, unspecified: Secondary | ICD-10-CM | POA: Diagnosis not present

## 2022-09-24 DIAGNOSIS — N189 Chronic kidney disease, unspecified: Secondary | ICD-10-CM | POA: Insufficient documentation

## 2022-09-24 DIAGNOSIS — R109 Unspecified abdominal pain: Secondary | ICD-10-CM

## 2022-09-24 DIAGNOSIS — Z79899 Other long term (current) drug therapy: Secondary | ICD-10-CM | POA: Diagnosis not present

## 2022-09-24 DIAGNOSIS — I700 Atherosclerosis of aorta: Secondary | ICD-10-CM | POA: Diagnosis not present

## 2022-09-24 DIAGNOSIS — Z7982 Long term (current) use of aspirin: Secondary | ICD-10-CM | POA: Diagnosis not present

## 2022-09-24 DIAGNOSIS — N2889 Other specified disorders of kidney and ureter: Secondary | ICD-10-CM | POA: Diagnosis not present

## 2022-09-24 DIAGNOSIS — R0789 Other chest pain: Secondary | ICD-10-CM | POA: Diagnosis not present

## 2022-09-24 DIAGNOSIS — R1031 Right lower quadrant pain: Secondary | ICD-10-CM | POA: Insufficient documentation

## 2022-09-24 DIAGNOSIS — R079 Chest pain, unspecified: Secondary | ICD-10-CM | POA: Diagnosis not present

## 2022-09-24 DIAGNOSIS — K5730 Diverticulosis of large intestine without perforation or abscess without bleeding: Secondary | ICD-10-CM | POA: Diagnosis not present

## 2022-09-24 DIAGNOSIS — E1122 Type 2 diabetes mellitus with diabetic chronic kidney disease: Secondary | ICD-10-CM | POA: Diagnosis not present

## 2022-09-24 DIAGNOSIS — N281 Cyst of kidney, acquired: Secondary | ICD-10-CM | POA: Diagnosis not present

## 2022-09-24 LAB — TROPONIN I (HIGH SENSITIVITY)
Troponin I (High Sensitivity): 6 ng/L (ref <18)
Troponin I (High Sensitivity): 5 ng/L (ref <18)

## 2022-09-24 LAB — COMPREHENSIVE METABOLIC PANEL
ALT: 35 U/L (ref 0–44)
AST: 28 U/L (ref 15–41)
Albumin: 3.9 g/dL (ref 3.5–5.0)
Alkaline Phosphatase: 64 U/L (ref 38–126)
Anion gap: 10 (ref 5–15)
BUN: 31 mg/dL — ABNORMAL HIGH (ref 8–23)
CO2: 26 mmol/L (ref 22–32)
Calcium: 9.4 mg/dL (ref 8.9–10.3)
Chloride: 102 mmol/L (ref 98–111)
Creatinine, Ser: 1.6 mg/dL — ABNORMAL HIGH (ref 0.61–1.24)
GFR, Estimated: 42 mL/min — ABNORMAL LOW (ref >60)
Glucose, Bld: 138 mg/dL — ABNORMAL HIGH (ref 70–99)
Potassium: 4.6 mmol/L (ref 3.5–5.1)
Sodium: 138 mmol/L (ref 135–145)
Total Bilirubin: 0.5 mg/dL (ref 0.3–1.2)
Total Protein: 7.7 g/dL (ref 6.5–8.1)

## 2022-09-24 LAB — CBC
HCT: 43.3 % (ref 39.0–52.0)
Hemoglobin: 13.7 g/dL (ref 13.0–17.0)
MCH: 28.8 pg (ref 26.0–34.0)
MCHC: 31.6 g/dL (ref 30.0–36.0)
MCV: 91.2 fL (ref 80.0–100.0)
Platelets: 249 10*3/uL (ref 150–400)
RBC: 4.75 MIL/uL (ref 4.22–5.81)
RDW: 14.2 % (ref 11.5–15.5)
WBC: 7.2 10*3/uL (ref 4.0–10.5)
nRBC: 0 % (ref 0.0–0.2)

## 2022-09-24 LAB — LIPASE, BLOOD: Lipase: 81 U/L — ABNORMAL HIGH (ref 11–51)

## 2022-09-24 MED ORDER — HYDROMORPHONE HCL 1 MG/ML IJ SOLN
1.0000 mg | Freq: Once | INTRAMUSCULAR | Status: AC
  Administered 2022-09-24: 1 mg via INTRAVENOUS
  Filled 2022-09-24: qty 1

## 2022-09-24 MED ORDER — GADOBUTROL 1 MMOL/ML IV SOLN
5.0000 mL | Freq: Once | INTRAVENOUS | Status: AC | PRN
  Administered 2022-09-24: 5 mL via INTRAVENOUS

## 2022-09-24 MED ORDER — ONDANSETRON HCL 4 MG/2ML IJ SOLN
4.0000 mg | Freq: Once | INTRAMUSCULAR | Status: AC
  Administered 2022-09-24: 4 mg via INTRAVENOUS
  Filled 2022-09-24: qty 2

## 2022-09-24 MED ORDER — SODIUM CHLORIDE 0.9 % IV BOLUS
500.0000 mL | Freq: Once | INTRAVENOUS | Status: AC
  Administered 2022-09-24: 500 mL via INTRAVENOUS

## 2022-09-24 NOTE — ED Notes (Signed)
Pt is pain free Still unable to produce urine

## 2022-09-24 NOTE — ED Notes (Signed)
Pt still unable to give urine sample at this time.

## 2022-09-24 NOTE — ED Notes (Signed)
Assisted MD in manual disimpaction  Cleans pt Vitals assessed IV removed Pt pending D/C

## 2022-09-24 NOTE — ED Notes (Signed)
Introduced self to pt Pt is A&Ox2 hx of dementia, family at bedside, states he is at baseline  Pt complains of Substernal CP that "feels like a little shot" Denies SOB, cough, fever and recent illness Complains of ABD pain across lower ABD, pt stated he feels like he has to poop.  Pt has been peeing and pooping normal per pt and family.  Denies HA, blurred vision and dizziness  12 lead EKG completed Gave urinal for UA

## 2022-09-24 NOTE — Discharge Instructions (Signed)
Your scan is were overall reassuring.  They demonstrated constipation.  You did undergo manual disimpaction.  Recommend that you take MiraLAX daily.  You can take up to 2-3 capfulls per day to get the desired effect.  At that time decrease down to 1 capful per day.  Take Senokot in addition to this.  Stop taking the fiber.  For any concerning symptoms return to the emergency room.

## 2022-09-24 NOTE — ED Provider Notes (Signed)
[place] EMERGENCY DEPARTMENT AT [facility] Provider Note   CSN: [id number] Arrival date & time: 09/24/22  1657     History  Chief Complaint  Patient presents with   Abdominal Pain   HPI Miguel Webb is a 82 y.o. male with hyperlipidemia, type 2 diabetes, hypertension, CKD and CAD presenting for abdominal pain.  Started on Saturday.  Located in the right lower quadrant.  It feels sharp and nonradiating.  It is intermittent.  Denies urinary changes.  Denies nausea vomiting diarrhea.  States has had difficulty with bowel movements.  States he had a small bowel movement this morning.  Unsure if he is passing gas.  Denies bloody output.  Denies fever at home.  Takes MiraLAX and Metamucil chronic constipation.   Abdominal Pain      Home Medications Prior to Admission medications   Medication Sig Start Date End Date Taking? Authorizing Provider  allopurinol (ZYLOPRIM) 100 MG tablet Take 200 mg by mouth daily. 05/29/22   [provider]  aspirin 81 MG tablet Take 81 mg by mouth daily.    [provider]  donepezil (ARICEPT) 10 MG tablet Take 10 mg by mouth daily. 11/09/20   [provider]  ipratropium (ATROVENT) 0.03 % nasal spray Place 1 spray into both nostrils as needed for allergies. 11/04/20   [provider]  metoprolol succinate (TOPROL XL) 25 MG 24 hr tablet Take 0.5 tablets (12.5 mg total) by mouth daily. 07/24/22   Marinus Maw, MD  polyethylene glycol (MIRALAX / GLYCOLAX) 17 g packet Take 17 g by mouth daily.    [provider]  polyethylene glycol-electrolytes (TRILYTE) 420 g solution Take 4,000 mLs by mouth as directed. Patient not taking: Reported on 12/21/2020 04/26/15   Corbin Ade, MD  psyllium (METAMUCIL SMOOTH TEXTURE) 58.6 % powder Take 1 packet by mouth daily. 06/29/22   Bethann Berkshire, MD  senna (SENOKOT) 8.6 MG tablet Take 1 tablet by mouth daily. Patient not taking: Reported on 12/21/2020    [provider]  simvastatin (ZOCOR) 20 MG tablet Take 20 mg by mouth daily.    [provider]  traZODone (DESYREL) 50 MG tablet Take 25-50 mg by mouth at bedtime as needed. 05/26/22   [provider]      Allergies    Patient has no known allergies.    Review of Systems   Review of Systems  Gastrointestinal:  Positive for abdominal pain.    Physical Exam Updated Vital Signs BP (!) 145/114 (BP Location: Right Arm)   Pulse (!) 133   Temp 98.6 F (37 C) (Oral)   Resp 18   Ht 5\' 9"  (1.753 m)   Wt 57.6 kg   SpO2 100%   BMI 18.75 kg/m  Physical Exam Vitals and nursing note reviewed.  HENT:     Head: Normocephalic and atraumatic.     Mouth/Throat:     Mouth: Mucous membranes are moist.  Eyes:     General:        Right eye: No discharge.        Left eye: No discharge.     Conjunctiva/sclera: Conjunctivae normal.  Cardiovascular:     Rate and Rhythm: Normal rate and regular rhythm.     Pulses: Normal pulses.     Heart sounds: Normal heart sounds.  Pulmonary:     Effort: Pulmonary effort is normal.     Breath sounds: Normal breath sounds.  Abdominal:  General: Abdomen is flat.     Palpations: Abdomen is soft.     Tenderness: There is abdominal tenderness in the right lower quadrant.  Skin:    General: Skin is warm and dry.  Neurological:     General: No focal deficit present.  Psychiatric:        Mood and Affect: Mood normal.     ED Results / Procedures / Treatments   Labs (all labs ordered are listed, but only abnormal results are displayed) Labs Reviewed  LIPASE, BLOOD - Abnormal; Notable for the following components:      Result Value   Lipase 81 (*)    All other components within normal limits  COMPREHENSIVE METABOLIC PANEL - Abnormal; Notable for the following components:   Glucose, Bld 138 (*)    BUN 31 (*)    Creatinine, Ser 1.60 (*)    GFR, Estimated 42 (*)    All other components within normal limits  CBC  URINALYSIS, ROUTINE  W REFLEX MICROSCOPIC  TROPONIN I (HIGH SENSITIVITY)  TROPONIN I (HIGH SENSITIVITY)    EKG None  Radiology DG Abdomen 1 View  Result Date: 09/24/2022 CLINICAL DATA:  Chest pain EXAM: ABDOMEN - 1 VIEW; PORTABLE CHEST - 1 VIEW COMPARISON:  CT 06/29/2022 and older FINDINGS: No consolidation, pneumothorax or effusion. Normal cardiopericardial silhouette without edema. Calcified aorta. Film is underinflated. Overlapping cardiac leads. There is gas seen along several air-filled mildly dilated loops of small bowel throughout the midabdomen the colon is air-filled as well. Air-fluid level along the stomach. Large amount of stool in the rectum. Moderate stool along the left side of the colon. Overlapping cardiac leads. No obvious free air beneath the diaphragm on the semi upright view. Degenerative changes of the spine and pelvis. IMPRESSION: No acute cardiopulmonary disease.  Underinflation. Significant stool in the rectum and left side of the colon. Distended air-filled loops of bowel throughout the midabdomen both small and large bowel. Please correlate with symptoms. Electronically Signed   By: Karen Kays M.D.   On: 09/24/2022 18:03   DG Chest Portable 1 View  Result Date: 09/24/2022 CLINICAL DATA:  Chest pain EXAM: ABDOMEN - 1 VIEW; PORTABLE CHEST - 1 VIEW COMPARISON:  CT 06/29/2022 and older FINDINGS: No consolidation, pneumothorax or effusion. Normal cardiopericardial silhouette without edema. Calcified aorta. Film is underinflated. Overlapping cardiac leads. There is gas seen along several air-filled mildly dilated loops of small bowel throughout the midabdomen the colon is air-filled as well. Air-fluid level along the stomach. Large amount of stool in the rectum. Moderate stool along the left side of the colon. Overlapping cardiac leads. No obvious free air beneath the diaphragm on the semi upright view. Degenerative changes of the spine and pelvis. IMPRESSION: No acute cardiopulmonary disease.   Underinflation. Significant stool in the rectum and left side of the colon. Distended air-filled loops of bowel throughout the midabdomen both small and large bowel. Please correlate with symptoms. Electronically Signed   By: Karen Kays M.D.   On: 09/24/2022 18:03    Procedures Procedures    Medications Ordered in ED Medications  sodium chloride 0.9 % bolus 500 mL (500 mLs Intravenous New Bag/Given 09/24/22 1748)  ondansetron (ZOFRAN) injection 4 mg (4 mg Intravenous Given 09/24/22 1749)  HYDROmorphone (DILAUDID) injection 1 mg (1 mg Intravenous Given 09/24/22 1749)  gadobutrol (GADAVIST) 1 MMOL/ML injection 5 mL (5 mLs Intravenous Contrast Given 09/24/22 1827)    ED Course/ Medical Decision Making/ A&P Clinical Course as of 09/24/22  5784  Mon Sep 24, 2022  1745 Reported to nursing that he was having some chest pain about the center of his chest with intermittent shortness of breath.  Prompted further evaluation with EKG, troponins and chest x-ray. [JR]    Clinical Course User Index [JR] Gareth Eagle, PA-C                             Medical Decision Making  Initial Impression and Ddx 82 year old well-appearing male presenting for abdominal pain.  Exam notable for right lower quadrant tenderness.  DDx includes appendicitis, diverticulitis, bowel obstruction, nephrolithiasis, pyelonephritis.  DDx for his chest pain includes ACS, PE, pneumothorax, pneumonia, reflux. Patient PMH that increases complexity of ED encounter:  hyperlipidemia, type 2 diabetes, hypertension, CKD and CAD  Interpretation of Diagnostics -I independent reviewed and interpreted the labs as followed: Elevated lipase, reduced GFR  -MRI of his abdomen/pelvis pending.  KUB showing evidence of significant stool burden.  Chest x-ray without any acute abnormalities  -EKG pending  Patient Reassessment and Ultimate Disposition/Management Treated initially for pain and nausea and vomiting resuscitated with 500 mL of  normal saline.  Primary concern at this time is appendicitis versus nephrolithiasis.  MRI abdomen pelvis is pending.  Evaluation for chest pain is also pending as well.  Signed out patient to Marita Kansas PA who will continue to evaluate and manage.  Plan will be dictated by chest pain workup and results of the MRI.  Patient management required discussion with the following services or consulting groups:  None  Complexity of Problems Addressed Acute complicated illness or Injury  Additional Data Reviewed and Analyzed Further history obtained from: Further history from spouse/family member, Past medical history and medications listed in the EMR, and Prior ED visit notes  Patient Encounter Risk Assessment Consideration of hospitalization         Final Clinical Impression(s) / ED Diagnoses Final diagnoses:  Abdominal pain, unspecified abdominal location    Rx / DC Orders ED Discharge Orders     None         Gareth Eagle, PA-C 09/24/22 1906    Gloris Manchester, MD 09/25/22 [phone]

## 2022-09-24 NOTE — ED Triage Notes (Signed)
Pt via POV c/o lower abdominal pain x 2 days. Denies n/v/d/constipation, dizziness, SOB, CP. Pt takes Miralax and Metamucil for chronic constipation. Pt has poor appetite. LBM was this morning. Pt appears quite uncomfortable in triage.

## 2022-09-24 NOTE — ED Provider Notes (Signed)
Signout received on this 82 year old male.  He is awaiting MRI scans at the time of signout along with chest x-ray. Physical Exam  BP (!) 144/81 (BP Location: Left Arm)   Pulse 83   Temp 98.3 F (36.8 C) (Oral)   Resp 16   Ht 5\' 9"  (1.753 m)   Wt 57.6 kg   SpO2 97%   BMI 18.75 kg/m     Procedures  Procedures  ED Course / MDM   Clinical Course as of 09/24/22 2215  Mon Sep 24, 2022  1745 Reported to nursing that he was having some chest pain about the center of his chest with intermittent shortness of breath.  Prompted further evaluation with EKG, troponins and chest x-ray. [JR]    Clinical Course User Index [JR] Gareth Eagle, PA-C   Medical Decision Making Amount and/or Complexity of Data Reviewed Labs: ordered. Radiology: ordered.  Risk Prescription drug management.   MRI scans without acute concern.  Does show significant stool burden.  Workup otherwise reassuring.  No findings suggesting ACS.  Family is agreeable for manual disimpaction.  Manual disimpaction performed.  Aggressive bowel regimen discussed.  No enema given in the emergency department due to patient's tolerance concern.  Family also defers enema in the emergency department.  They are agreeable with aggressive bowel regimen.  Patient discharged in appropriate condition. Discussed follow-up with PCP.       Marita Kansas, PA-C 09/24/22 2217    Glendora Score, MD 09/26/22 [phone]

## 2022-09-28 ENCOUNTER — Telehealth: Payer: Self-pay

## 2022-09-28 NOTE — Telephone Encounter (Signed)
Transition Care Management Unsuccessful Follow-up Telephone Call  Date of discharge and from where:  09/24/2022 [facility]  Attempts:  1st Attempt  Reason for unsuccessful TCM follow-up call:  No answer/busy  Miguel Webb [place]  [facility] Population Health Community Resource Care Guide   [email or web address]  ?? [phone]   Website: [email or web address]  [email or web address]      

## 2022-10-01 ENCOUNTER — Telehealth: Payer: Self-pay

## 2022-10-01 NOTE — Telephone Encounter (Signed)
Transition Care Management Unsuccessful Follow-up Telephone Call  Date of discharge and from where:  09/24/2022 [facility]  Attempts:  2nd Attempt  Reason for unsuccessful TCM follow-up call:  Unable to leave message  Broxton Broady [facility]  [facility] Population Health Community Resource Care Guide   [email or web address]  ?? [phone]   Website: [email or web address]  [email or web address]

## 2022-10-09 ENCOUNTER — Other Ambulatory Visit: Payer: Self-pay | Admitting: *Deleted

## 2022-10-09 DIAGNOSIS — I739 Peripheral vascular disease, unspecified: Secondary | ICD-10-CM

## 2022-10-11 DIAGNOSIS — E785 Hyperlipidemia, unspecified: Secondary | ICD-10-CM | POA: Diagnosis not present

## 2022-10-11 DIAGNOSIS — N1832 Chronic kidney disease, stage 3b: Secondary | ICD-10-CM | POA: Diagnosis not present

## 2022-10-11 DIAGNOSIS — Z79899 Other long term (current) drug therapy: Secondary | ICD-10-CM | POA: Diagnosis not present

## 2022-10-11 DIAGNOSIS — M1000 Idiopathic gout, unspecified site: Secondary | ICD-10-CM | POA: Diagnosis not present

## 2022-10-11 DIAGNOSIS — E1129 Type 2 diabetes mellitus with other diabetic kidney complication: Secondary | ICD-10-CM | POA: Diagnosis not present

## 2022-10-18 ENCOUNTER — Encounter: Payer: Medicare Other | Admitting: Vascular Surgery

## 2022-10-18 ENCOUNTER — Ambulatory Visit (HOSPITAL_COMMUNITY): Payer: Medicare Other

## 2022-10-22 ENCOUNTER — Ambulatory Visit (HOSPITAL_COMMUNITY): Payer: Medicare Other

## 2022-10-26 ENCOUNTER — Encounter: Payer: Medicare Other | Admitting: Vascular Surgery

## 2022-10-31 DIAGNOSIS — E785 Hyperlipidemia, unspecified: Secondary | ICD-10-CM | POA: Diagnosis not present

## 2022-10-31 DIAGNOSIS — N1832 Chronic kidney disease, stage 3b: Secondary | ICD-10-CM | POA: Diagnosis not present

## 2022-10-31 DIAGNOSIS — G309 Alzheimer's disease, unspecified: Secondary | ICD-10-CM | POA: Diagnosis not present

## 2022-10-31 DIAGNOSIS — R7309 Other abnormal glucose: Secondary | ICD-10-CM | POA: Diagnosis not present

## 2022-10-31 DIAGNOSIS — E1122 Type 2 diabetes mellitus with diabetic chronic kidney disease: Secondary | ICD-10-CM | POA: Diagnosis not present

## 2022-12-05 ENCOUNTER — Ambulatory Visit (HOSPITAL_COMMUNITY): Payer: Medicare Other

## 2022-12-05 ENCOUNTER — Encounter: Payer: Medicare Other | Admitting: Vascular Surgery

## 2023-01-04 DIAGNOSIS — Z23 Encounter for immunization: Secondary | ICD-10-CM | POA: Diagnosis not present

## 2023-01-21 ENCOUNTER — Ambulatory Visit (HOSPITAL_COMMUNITY): Payer: Medicare Other

## 2023-01-21 ENCOUNTER — Encounter: Payer: Medicare Other | Admitting: Surgery

## 2023-02-05 ENCOUNTER — Encounter: Payer: Self-pay | Admitting: Vascular Surgery

## 2023-02-05 ENCOUNTER — Ambulatory Visit: Payer: Medicare Other

## 2023-02-05 ENCOUNTER — Ambulatory Visit (INDEPENDENT_AMBULATORY_CARE_PROVIDER_SITE_OTHER): Payer: Medicare Other | Admitting: Vascular Surgery

## 2023-02-05 VITALS — BP 112/74 | HR 73 | Temp 97.4°F | Ht 69.0 in | Wt 126.2 lb

## 2023-02-05 DIAGNOSIS — I739 Peripheral vascular disease, unspecified: Secondary | ICD-10-CM | POA: Diagnosis not present

## 2023-02-05 NOTE — Progress Notes (Signed)
Patient name: Miguel Webb MRN: [id number] DOB: May 13, 1940 Sex: male  REASON FOR CONSULT: Nonpalpable pulses with ischemic changes to toes  HPI: Miguel Webb is a 82 y.o. male, with history of hypertension, hyperlipidemia, CKD stage III, diabetes that presents for evaluation of nonpalpable pulses with ischemic changes to the toes.  The patient and family said he had an ingrown toenail on the right.  He was potentially debating surgery of his right toenail to have this removed.  He was referred here to make sure this would heal.  Family states this has since healed and he is not going to pursue surgery.  He has no leg pain when walking or no other issues.  Past Medical History:  Diagnosis Date   Arthritis    Carotid artery disease (HCC)    CKD (chronic kidney disease) stage 3, GFR 30-59 ml/min (HCC)    Erectile dysfunction    Essential hypertension    Fecal impaction (HCC)    Hyperlipidemia    Proteinuria    Type 2 diabetes mellitus (HCC)     Past Surgical History:  Procedure Laterality Date   CIRCUMCISION     COLONOSCOPY N/A 05/11/2015   Procedure: COLONOSCOPY;  Surgeon: Corbin Ade, MD;  Location: [facility];  Service: Endoscopy;  Laterality: N/A;  230    ENDARTERECTOMY Left 10/20/2015   Procedure: Left CAROTID Endartectomy;  Surgeon: Chuck Hint, MD;  Location: [facility] OR;  Service: Vascular;  Laterality: Left;   PATCH ANGIOPLASTY Left 10/20/2015   Procedure: PATCH ANGIOPLASTY;  Surgeon: Chuck Hint, MD;  Location: [facility] OR;  Service: Vascular;  Laterality: Left;   WISDOM TOOTH EXTRACTION      Family History  Problem Relation Age of Onset   Peptic Ulcer Disease Mother    Stroke Father    Hypertension Father    Breast cancer Sister    Hypertension Sister    Prostate cancer Brother    Hypertension Brother     SOCIAL HISTORY: Social History   Socioeconomic History   Marital status: Married    Spouse name: Not on file   Number of children:  Not on file   Years of education: Not on file   Highest education level: Not on file  Occupational History   Not on file  Tobacco Use   Smoking status: Former    Types: Cigarettes, Pipe   Smokeless tobacco: Former    Types: Chew   Tobacco comments:    Quit smoking cigarettes and Pipe 28-30 years ago  Advertising account planner   Vaping status: Never Used  Substance and Sexual Activity   Alcohol use: Not Currently    Comment: 1-2 times per week drinks a little vodka   Drug use: No   Sexual activity: Not on file  Other Topics Concern   Not on file  Social History Narrative   Lives in [place] with wife and son   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   Social Determinants of Health   Financial Resource Strain: Not on file  Food Insecurity: No Food Insecurity (01/24/2022)   Hunger Vital Sign    Worried About Running Out of Food in the Last Year: Never true    Ran Out of Food in the Last Year: Never true  Transportation Needs: No Transportation Needs (01/24/2022)   PRAPARE - Administrator, Civil Service (Medical): No    Lack of Transportation (Non-Medical):  No  Physical Activity: Not on file  Stress: Not on file  Social Connections: Not on file  Intimate Partner Violence: Not on file    No Known Allergies  Current Outpatient Medications  Medication Sig Dispense Refill   allopurinol (ZYLOPRIM) 100 MG tablet Take 200 mg by mouth daily.     aspirin 81 MG tablet Take 81 mg by mouth daily.     donepezil (ARICEPT) 10 MG tablet Take 10 mg by mouth daily.     ipratropium (ATROVENT) 0.03 % nasal spray Place 1 spray into both nostrils as needed for allergies.     metoprolol succinate (TOPROL XL) 25 MG 24 hr tablet Take 0.5 tablets (12.5 mg total) by mouth daily. 45 tablet 3   polyethylene glycol (MIRALAX / GLYCOLAX) 17 g packet Take 17 g by mouth daily.     polyethylene glycol-electrolytes (TRILYTE) 420 g solution Take 4,000  mLs by mouth as directed. (Patient not taking: Reported on 12/21/2020) 4000 mL 0   psyllium (METAMUCIL SMOOTH TEXTURE) 58.6 % powder Take 1 packet by mouth daily. 283 g 12   senna (SENOKOT) 8.6 MG tablet Take 1 tablet by mouth daily. (Patient not taking: Reported on 12/21/2020)     simvastatin (ZOCOR) 20 MG tablet Take 20 mg by mouth daily.     traZODone (DESYREL) 50 MG tablet Take 25-50 mg by mouth at bedtime as needed.     No current facility-administered medications for this visit.    REVIEW OF SYSTEMS:  [X]  denotes positive finding, [ ]  denotes negative finding Cardiac  Comments:  Chest pain or chest pressure:    Shortness of breath upon exertion:    Short of breath when lying flat:    Irregular heart rhythm:        Vascular    Pain in calf, thigh, or hip brought on by ambulation:    Pain in feet at night that wakes you up from your sleep:     Blood clot in your veins:    Leg swelling:         Pulmonary    Oxygen at home:    Productive cough:     Wheezing:         Neurologic    Sudden weakness in arms or legs:     Sudden numbness in arms or legs:     Sudden onset of difficulty speaking or slurred speech:    Temporary loss of vision in one eye:     Problems with dizziness:         Gastrointestinal    Blood in stool:     Vomited blood:         Genitourinary    Burning when urinating:     Blood in urine:        Psychiatric    Major depression:         Hematologic    Bleeding problems:    Problems with blood clotting too easily:        Skin    Rashes or ulcers:        Constitutional    Fever or chills:      PHYSICAL EXAM: There were no vitals filed for this visit.  GENERAL: The patient is a well-nourished male, in no acute distress. The vital signs are documented above. CARDIAC: There is a regular rate and rhythm.  VASCULAR:  Bilateral femoral pulses palpable Bilateral popliteal pulses palpable Bilateral DP PT signals brisk by Doppler PULMONARY: No  respiratory distress. ABDOMEN: Soft and non-tender. MUSCULOSKELETAL: There are no major deformities or cyanosis. NEUROLOGIC: No focal weakness or paresthesias are detected. SKIN: There are no ulcers or rashes noted. PSYCHIATRIC: The patient has a normal affect.  DATA:   ABIs today are 1.01 on the right monophasic and 0.86 on the left biphasic  Assessment/Plan:  82 y.o. male, with history of hypertension, hyperlipidemia, CKD stage III, diabetes that presents for evaluation of nonpalpable pulses with ischemic changes to the toes.  The family states the concern was his right great toenail where he had an ingrown toenail and was considering surgery.  Fortunately his ingrown toenail on the right has healed.  States he is no longer considering surgery.  He has otherwise no symptoms from PAD including no leg pain, no claudication, no rest pain etc.  He does have a normal ABI of 1.01 but this is likely falsely elevated as he has monophasic waveforms at the ankle.  However his signals are very brisk by Doppler.  I discussed he let me know if he develops a nonhealing wound or if he eventually pursues surgery with podiatry and this does not heal.  His toe pressures are 72 and generally would be adequate for wound healing.   Cephus Shelling, MD [facility] Office: [phone]

## 2023-02-12 LAB — VAS US ABI WITH/WO TBI
Left ABI: 0.86
Right ABI: 1.01

## 2023-03-04 DIAGNOSIS — N1832 Chronic kidney disease, stage 3b: Secondary | ICD-10-CM | POA: Diagnosis not present

## 2023-03-04 DIAGNOSIS — E1129 Type 2 diabetes mellitus with other diabetic kidney complication: Secondary | ICD-10-CM | POA: Diagnosis not present

## 2023-03-04 DIAGNOSIS — G309 Alzheimer's disease, unspecified: Secondary | ICD-10-CM | POA: Diagnosis not present

## 2023-03-04 DIAGNOSIS — Z79899 Other long term (current) drug therapy: Secondary | ICD-10-CM | POA: Diagnosis not present

## 2023-04-01 DIAGNOSIS — N1832 Chronic kidney disease, stage 3b: Secondary | ICD-10-CM | POA: Diagnosis not present

## 2023-04-01 DIAGNOSIS — G301 Alzheimer's disease with late onset: Secondary | ICD-10-CM | POA: Diagnosis not present

## 2023-04-01 DIAGNOSIS — E1122 Type 2 diabetes mellitus with diabetic chronic kidney disease: Secondary | ICD-10-CM | POA: Diagnosis not present

## 2023-07-10 DIAGNOSIS — E1129 Type 2 diabetes mellitus with other diabetic kidney complication: Secondary | ICD-10-CM | POA: Diagnosis not present

## 2023-07-10 DIAGNOSIS — Z79899 Other long term (current) drug therapy: Secondary | ICD-10-CM | POA: Diagnosis not present

## 2023-07-10 DIAGNOSIS — N1832 Chronic kidney disease, stage 3b: Secondary | ICD-10-CM | POA: Diagnosis not present

## 2023-07-10 DIAGNOSIS — G300 Alzheimer's disease with early onset: Secondary | ICD-10-CM | POA: Diagnosis not present

## 2023-07-18 DIAGNOSIS — N1832 Chronic kidney disease, stage 3b: Secondary | ICD-10-CM | POA: Diagnosis not present

## 2023-07-18 DIAGNOSIS — E1122 Type 2 diabetes mellitus with diabetic chronic kidney disease: Secondary | ICD-10-CM | POA: Diagnosis not present

## 2023-07-18 DIAGNOSIS — E785 Hyperlipidemia, unspecified: Secondary | ICD-10-CM | POA: Diagnosis not present

## 2023-08-27 ENCOUNTER — Other Ambulatory Visit: Payer: Self-pay | Admitting: Internal Medicine

## 2023-10-24 DIAGNOSIS — Z79899 Other long term (current) drug therapy: Secondary | ICD-10-CM | POA: Diagnosis not present

## 2023-10-24 DIAGNOSIS — E785 Hyperlipidemia, unspecified: Secondary | ICD-10-CM | POA: Diagnosis not present

## 2023-10-24 DIAGNOSIS — G309 Alzheimer's disease, unspecified: Secondary | ICD-10-CM | POA: Diagnosis not present

## 2023-10-24 DIAGNOSIS — E1129 Type 2 diabetes mellitus with other diabetic kidney complication: Secondary | ICD-10-CM | POA: Diagnosis not present

## 2023-10-24 DIAGNOSIS — N1832 Chronic kidney disease, stage 3b: Secondary | ICD-10-CM | POA: Diagnosis not present

## 2023-11-13 DIAGNOSIS — N1832 Chronic kidney disease, stage 3b: Secondary | ICD-10-CM | POA: Diagnosis not present

## 2023-11-13 DIAGNOSIS — E1122 Type 2 diabetes mellitus with diabetic chronic kidney disease: Secondary | ICD-10-CM | POA: Diagnosis not present

## 2023-11-13 DIAGNOSIS — Z1389 Encounter for screening for other disorder: Secondary | ICD-10-CM | POA: Diagnosis not present

## 2023-11-13 DIAGNOSIS — G309 Alzheimer's disease, unspecified: Secondary | ICD-10-CM | POA: Diagnosis not present

## 2023-11-13 DIAGNOSIS — Z79899 Other long term (current) drug therapy: Secondary | ICD-10-CM | POA: Diagnosis not present

## 2023-12-07 ENCOUNTER — Other Ambulatory Visit: Payer: Self-pay

## 2023-12-07 ENCOUNTER — Emergency Department (HOSPITAL_COMMUNITY)
Admission: EM | Admit: 2023-12-07 | Discharge: 2023-12-07 | Disposition: A | Discharge status: Home / Self Care | Attending: Emergency Medicine | Admitting: Emergency Medicine

## 2023-12-07 ENCOUNTER — Emergency Department (HOSPITAL_COMMUNITY)

## 2023-12-07 ENCOUNTER — Encounter (HOSPITAL_COMMUNITY): Payer: Self-pay

## 2023-12-07 DIAGNOSIS — K5641 Fecal impaction: Secondary | ICD-10-CM | POA: Diagnosis not present

## 2023-12-07 DIAGNOSIS — Z7982 Long term (current) use of aspirin: Secondary | ICD-10-CM | POA: Insufficient documentation

## 2023-12-07 DIAGNOSIS — K5900 Constipation, unspecified: Secondary | ICD-10-CM | POA: Diagnosis not present

## 2023-12-07 DIAGNOSIS — I2510 Atherosclerotic heart disease of native coronary artery without angina pectoris: Secondary | ICD-10-CM | POA: Diagnosis not present

## 2023-12-07 DIAGNOSIS — I129 Hypertensive chronic kidney disease with stage 1 through stage 4 chronic kidney disease, or unspecified chronic kidney disease: Secondary | ICD-10-CM | POA: Diagnosis not present

## 2023-12-07 DIAGNOSIS — K7689 Other specified diseases of liver: Secondary | ICD-10-CM | POA: Diagnosis not present

## 2023-12-07 DIAGNOSIS — E119 Type 2 diabetes mellitus without complications: Secondary | ICD-10-CM | POA: Insufficient documentation

## 2023-12-07 DIAGNOSIS — R1030 Lower abdominal pain, unspecified: Secondary | ICD-10-CM | POA: Insufficient documentation

## 2023-12-07 DIAGNOSIS — K5730 Diverticulosis of large intestine without perforation or abscess without bleeding: Secondary | ICD-10-CM | POA: Diagnosis not present

## 2023-12-07 DIAGNOSIS — Z79899 Other long term (current) drug therapy: Secondary | ICD-10-CM | POA: Insufficient documentation

## 2023-12-07 DIAGNOSIS — N189 Chronic kidney disease, unspecified: Secondary | ICD-10-CM | POA: Diagnosis not present

## 2023-12-07 LAB — COMPREHENSIVE METABOLIC PANEL WITH GFR
ALT: 18 U/L (ref 0–44)
AST: 24 U/L (ref 15–41)
Albumin: 4 g/dL (ref 3.5–5.0)
Alkaline Phosphatase: 62 U/L (ref 38–126)
Anion gap: 14 (ref 5–15)
BUN: 19 mg/dL (ref 8–23)
CO2: 24 mmol/L (ref 22–32)
Calcium: 9.5 mg/dL (ref 8.9–10.3)
Chloride: 101 mmol/L (ref 98–111)
Creatinine, Ser: 1.68 mg/dL — ABNORMAL HIGH (ref 0.61–1.24)
GFR, Estimated: 40 mL/min — ABNORMAL LOW (ref >60)
Glucose, Bld: 100 mg/dL — ABNORMAL HIGH (ref 70–99)
Potassium: 4.5 mmol/L (ref 3.5–5.1)
Sodium: 139 mmol/L (ref 135–145)
Total Bilirubin: 0.4 mg/dL (ref 0.0–1.2)
Total Protein: 7.6 g/dL (ref 6.5–8.1)

## 2023-12-07 LAB — CBC WITH DIFFERENTIAL/PLATELET
Abs Immature Granulocytes: 0.02 K/uL (ref 0.00–0.07)
Basophils Absolute: 0 K/uL (ref 0.0–0.1)
Basophils Relative: 0 %
Eosinophils Absolute: 0 K/uL (ref 0.0–0.5)
Eosinophils Relative: 0 %
HCT: 46.9 % (ref 39.0–52.0)
Hemoglobin: 14.9 g/dL (ref 13.0–17.0)
Immature Granulocytes: 0 %
Lymphocytes Relative: 20 %
Lymphs Abs: 1.2 K/uL (ref 0.7–4.0)
MCH: 29.1 pg (ref 26.0–34.0)
MCHC: 31.8 g/dL (ref 30.0–36.0)
MCV: 91.6 fL (ref 80.0–100.0)
Monocytes Absolute: 0.5 K/uL (ref 0.1–1.0)
Monocytes Relative: 9 %
Neutro Abs: 4.1 K/uL (ref 1.7–7.7)
Neutrophils Relative %: 71 %
Platelets: 228 K/uL (ref 150–400)
RBC: 5.12 MIL/uL (ref 4.22–5.81)
RDW: 14.7 % (ref 11.5–15.5)
WBC: 5.9 K/uL (ref 4.0–10.5)
nRBC: 0 % (ref 0.0–0.2)

## 2023-12-07 MED ORDER — GOLYTELY 236 G PO SOLR: 4000.0000 mL | Freq: Once | ORAL | 0 refills | Status: AC

## 2023-12-07 MED ORDER — IOHEXOL 300 MG/ML  SOLN
100.0000 mL | Freq: Once | INTRAMUSCULAR | Status: AC | PRN
  Administered 2023-12-07: 80 mL via INTRAVENOUS

## 2023-12-07 NOTE — Discharge Instructions (Addendum)
 Please drink the GoLytely  over the next 2 days, this will help to get rid of the stool, you will need to see the GI doctors in follow-up, please see the phone number above if you do not already have a GI doctor.

## 2023-12-07 NOTE — ED Provider Notes (Signed)
 Pt accepted at change of shift -the patient has signs of fecal impaction and constipation, he is not tolerating a rectal exam despite the family requesting that I do it.  The family states that they are collectively helping to make his decision since she has dementia.  As he did not tolerate the exam he will be discharged with oral laxatives   Cleotilde Rogue, MD 12/07/23 [phone]

## 2023-12-07 NOTE — ED Provider Notes (Signed)
 [place] EMERGENCY DEPARTMENT AT [facility] Provider Note   CSN: [id number] Arrival date & time: 12/07/23  1140     Patient presents with: Constipation   Miguel Webb is a 83 y.o. male.   Patient complains of lower abdominal pain.  He has a history of hypertension and diabetes and coronary artery disease with kidney disease.  The history is provided by the patient and medical records. No language interpreter was used.  Constipation Severity:  Mild Timing:  Intermittent Progression:  Waxing and waning Chronicity:  New Context: not dehydration   Stool description:  None produced Associated symptoms: abdominal pain   Associated symptoms: no back pain and no diarrhea        Prior to Admission medications   Medication Sig Start Date End Date Taking? Authorizing Provider  allopurinol (ZYLOPRIM) 100 MG tablet Take 200 mg by mouth daily. 05/29/22   [provider]  aspirin  81 MG tablet Take 81 mg by mouth daily.    [provider]  donepezil (ARICEPT) 10 MG tablet Take 10 mg by mouth daily. 11/09/20   [provider]  ipratropium (ATROVENT) 0.03 % nasal spray Place 1 spray into both nostrils as needed for allergies. Patient not taking: Reported on 02/05/2023 11/04/20   [provider]  metoprolol  succinate (TOPROL -XL) 25 MG 24 hr tablet TAKE 1/2 TABLET BY MOUTH EVERY DAY 08/27/23   Waddell Danelle ORN, MD  polyethylene glycol (MIRALAX / GLYCOLAX) 17 g packet Take 17 g by mouth daily.    [provider]  polyethylene glycol-electrolytes (TRILYTE) 420 g solution Take 4,000 mLs by mouth as directed. Patient not taking: Reported on 12/21/2020 04/26/15   Shaaron Lamar HERO, MD  psyllium (METAMUCIL SMOOTH TEXTURE) 58.6 % powder Take 1 packet by mouth daily. 06/29/22   Canda Podgorski, MD  senna (SENOKOT) 8.6 MG tablet Take 1 tablet by mouth daily. Patient not taking: Reported on 12/21/2020    [provider]  simvastatin  (ZOCOR ) 20 MG  tablet Take 20 mg by mouth daily.    [provider]  traZODone (DESYREL) 50 MG tablet Take 25-50 mg by mouth at bedtime as needed. 05/26/22   [provider]    Allergies: Patient has no known allergies.    Review of Systems  Constitutional:  Negative for appetite change and fatigue.  HENT:  Negative for congestion, ear discharge and sinus pressure.   Eyes:  Negative for discharge.  Respiratory:  Negative for cough.   Cardiovascular:  Negative for chest pain.  Gastrointestinal:  Positive for abdominal pain and constipation. Negative for diarrhea.  Genitourinary:  Negative for frequency and hematuria.  Musculoskeletal:  Negative for back pain.  Skin:  Negative for rash.  Neurological:  Negative for seizures and headaches.  Psychiatric/Behavioral:  Negative for hallucinations.     Updated Vital Signs BP (!) 138/90   Pulse 97   Temp 97.6 F (36.4 C) (Oral)   Ht 5' 9 (1.753 m)   Wt 56.7 kg   SpO2 97%   BMI 18.46 kg/m   Physical Exam Vitals and nursing note reviewed.  Constitutional:      Appearance: He is well-developed.  HENT:     Head: Normocephalic.     Nose: Congestion present.  Eyes:     General: No scleral icterus.    Conjunctiva/sclera: Conjunctivae normal.  Neck:     Thyroid : No thyromegaly.  Cardiovascular:     Rate and Rhythm: Normal rate and regular rhythm.  Heart sounds: No murmur heard.    No friction rub. No gallop.  Pulmonary:     Breath sounds: No stridor. No wheezing or rales.  Chest:     Chest wall: No tenderness.  Abdominal:     General: There is no distension.     Tenderness: There is abdominal tenderness. There is no rebound.  Musculoskeletal:        General: Normal range of motion.     Cervical back: Neck supple.  Lymphadenopathy:     Cervical: No cervical adenopathy.  Skin:    Findings: No erythema or rash.  Neurological:     Mental Status: He is alert and oriented to person, place, and time.     Motor: No  abnormal muscle tone.     Coordination: Coordination normal.  Psychiatric:        Behavior: Behavior normal.     (all labs ordered are listed, but only abnormal results are displayed) Labs Reviewed  COMPREHENSIVE METABOLIC PANEL WITH GFR - Abnormal; Notable for the following components:      Result Value   Glucose, Bld 100 (*)    Creatinine, Ser 1.68 (*)    GFR, Estimated 40 (*)    All other components within normal limits  CBC WITH DIFFERENTIAL/PLATELET  URINALYSIS, ROUTINE W REFLEX MICROSCOPIC    EKG: None  Radiology: No results found.   Procedures   Medications Ordered in the ED  iohexol  (OMNIPAQUE ) 300 MG/ML solution 100 mL (80 mLs Intravenous Contrast Given 12/07/23 1524)   Chemistries showed creatinine 1.68 CBC unremarkable.  CT scan and urinalysis pending                                 Medical Decision Making Amount and/or Complexity of Data Reviewed Labs: ordered. Radiology: ordered.  Risk Prescription drug management.   Patient with mild lower abdominal discomfort.    Disposition is determined by Dr. Cleotilde     Final diagnoses:  None    ED Discharge Orders     None          Suzette Pac, MD 12/07/23 1650

## 2023-12-07 NOTE — ED Triage Notes (Signed)
 Pt's family stated that pt is constipated and is now having lower abd pain

## 2023-12-11 ENCOUNTER — Ambulatory Visit: Attending: Internal Medicine | Admitting: Internal Medicine

## 2023-12-11 VITALS — BP 136/80 | HR 70 | Ht 69.0 in | Wt 131.0 lb

## 2023-12-11 DIAGNOSIS — I493 Ventricular premature depolarization: Secondary | ICD-10-CM

## 2023-12-11 NOTE — Patient Instructions (Signed)
 Medication Instructions:  Your physician recommends that you continue on your current medications as directed. Please refer to the Current Medication list given to you today.  *If you need a refill on your cardiac medications before your next appointment, please call your pharmacy*  Lab Work: NONE   If you have labs (blood work) drawn today and your tests are completely normal, you will receive your results only by: MyChart Message (if you have MyChart) OR A paper copy in the mail If you have any lab test that is abnormal or we need to change your treatment, we will call you to review the results.  Testing/Procedures: NONE   Follow-Up: At [facility], you and your health needs are our priority.  As part of our continuing mission to provide you with exceptional heart care, our providers are all part of one team.  This team includes your primary Cardiologist (physician) and Advanced Practice Providers or APPs (Physician Assistants and Nurse Practitioners) who all work together to provide you with the care you need, when you need it.  Your next appointment:    As Needed   Provider:   Dr. Almetta     We recommend signing up for the patient portal called MyChart.  Sign up information is provided on this After Visit Summary.  MyChart is used to connect with patients for Virtual Visits (Telemedicine).  Patients are able to view lab/test results, encounter notes, upcoming appointments, etc.  Non-urgent messages can be sent to your provider as well.   To learn more about what you can do with MyChart, go to [email or web address].   Other Instructions Thank you for choosing [facility]!

## 2023-12-11 NOTE — Progress Notes (Signed)
 HPI Miguel Webb returns today for followup. He is a pleasant 83 yo man with h/o chronic renal insufficiency, HTN, and DM. He had been followed by Dr. MILUS. For PVC's (7% burden 5 years ago) and in the interim he notes no symptoms. He has had some bowel obstruction. This has improved.  No swelling.   No Known Allergies   Current Outpatient Medications  Medication Sig Dispense Refill   allopurinol (ZYLOPRIM) 100 MG tablet Take 200 mg by mouth daily.     aspirin  81 MG tablet Take 81 mg by mouth daily.     donepezil (ARICEPT) 10 MG tablet Take 10 mg by mouth daily.     metoprolol  succinate (TOPROL -XL) 25 MG 24 hr tablet TAKE 1/2 TABLET BY MOUTH EVERY DAY 15 tablet 0   polyethylene glycol (MIRALAX / GLYCOLAX) 17 g packet Take 17 g by mouth daily.     polyethylene glycol-electrolytes (TRILYTE) 420 g solution Take 4,000 mLs by mouth as directed. 4000 mL 0   psyllium (METAMUCIL SMOOTH TEXTURE) 58.6 % powder Take 1 packet by mouth daily. 283 g 12   senna (SENOKOT) 8.6 MG tablet Take 1 tablet by mouth daily.     simvastatin  (ZOCOR ) 20 MG tablet Take 20 mg by mouth daily.     traZODone (DESYREL) 50 MG tablet Take 25-50 mg by mouth at bedtime as needed.     ipratropium (ATROVENT) 0.03 % nasal spray Place 1 spray into both nostrils as needed for allergies. (Patient not taking: Reported on 12/11/2023)     No current facility-administered medications for this visit.     Past Medical History:  Diagnosis Date   Arthritis    Carotid artery disease (HCC)    CKD (chronic kidney disease) stage 3, GFR 30-59 ml/min (HCC)    Erectile dysfunction    Essential hypertension    Fecal impaction (HCC)    Hyperlipidemia    Proteinuria    Type 2 diabetes mellitus (HCC)     ROS:   All systems reviewed and negative except as noted in the HPI.   Past Surgical History:  Procedure Laterality Date   CIRCUMCISION     COLONOSCOPY N/A 05/11/2015   Procedure: COLONOSCOPY;  Surgeon: Lamar CHRISTELLA Hollingshead, MD;   Location: [facility];  Service: Endoscopy;  Laterality: N/A;  230    ENDARTERECTOMY Left 10/20/2015   Procedure: Left CAROTID Endartectomy;  Surgeon: Lonni GORMAN Blade, MD;  Location: [facility] OR;  Service: Vascular;  Laterality: Left;   PATCH ANGIOPLASTY Left 10/20/2015   Procedure: PATCH ANGIOPLASTY;  Surgeon: Lonni GORMAN Blade, MD;  Location: [facility] OR;  Service: Vascular;  Laterality: Left;   WISDOM TOOTH EXTRACTION       Family History  Problem Relation Age of Onset   Peptic Ulcer Disease Mother    Stroke Father    Hypertension Father    Breast cancer Sister    Hypertension Sister    Prostate cancer Brother    Hypertension Brother      Social History   Socioeconomic History   Marital status: Married    Spouse name: Not on file   Number of children: Not on file   Years of education: Not on file   Highest education level: Not on file  Occupational History   Not on file  Tobacco Use   Smoking status: Former    Types: Cigarettes, Pipe   Smokeless tobacco: Former    Types: Chew   Tobacco comments:  Quit smoking cigarettes and Pipe 28-30 years ago  Vaping Use   Vaping status: Never Used  Substance and Sexual Activity   Alcohol use: Not Currently    Comment: 1-2 times per week drinks a little vodka   Drug use: No   Sexual activity: Not on file  Other Topics Concern   Not on file  Social History Narrative   Lives in [place] with wife and son   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   Social Drivers of Corporate investment banker Strain: Not on file  Food Insecurity: No Food Insecurity (01/24/2022)   Hunger Vital Sign    Worried About Running Out of Food in the Last Year: Never true    Ran Out of Food in the Last Year: Never true  Transportation Needs: No Transportation Needs (01/24/2022)   PRAPARE - Administrator, Civil Service (Medical): No    Lack of Transportation (Non-Medical): No   Physical Activity: Not on file  Stress: Not on file  Social Connections: Not on file  Intimate Partner Violence: Not on file     BP 136/80 (BP Location: Right Arm, Patient Position: Sitting, Cuff Size: Normal)   Pulse 70   Ht 5' 9 (1.753 m)   Wt 131 lb (59.4 kg)   SpO2 97%   BMI 19.35 kg/m   Physical Exam:  Well appearing elderly man, NAD HEENT: Unremarkable Neck:  No JVD, no thyromegally Lymphatics:  No adenopathy Back:  No CVA tenderness Lungs:  Clear with no wheezes HEART:  Regular rate rhythm, no murmurs, no rubs, no clicks Abd:  soft, positive bowel sounds, no organomegally, no rebound, no guarding Ext:  2 plus pulses, no edema, no cyanosis, no clubbing Skin:  No rashes no nodules Neuro:  CN II through XII intact, motor grossly intact  EKG - nsr with RBBB   Assess/Plan:  PVC's - his symptoms are well controlled. He will continue low dose toprol . HTN - his bp is stable. I asked him to stop his lisinopril  and continue toprol .   Danelle Theodora Lalanne,MD
